# Patient Record
Sex: Female | Born: 1995 | Race: White | Hispanic: No | Marital: Married | Smoking: Never smoker
Health system: Southern US, Community
[De-identification: ages and names within clinical notes are randomized; demographics above are authoritative.]

## PROBLEM LIST (undated history)

## (undated) DIAGNOSIS — F32A Depression, unspecified: Secondary | ICD-10-CM

## (undated) DIAGNOSIS — I10 Essential (primary) hypertension: Secondary | ICD-10-CM

## (undated) DIAGNOSIS — N760 Acute vaginitis: Secondary | ICD-10-CM

## (undated) DIAGNOSIS — F329 Major depressive disorder, single episode, unspecified: Secondary | ICD-10-CM

## (undated) DIAGNOSIS — B9689 Other specified bacterial agents as the cause of diseases classified elsewhere: Secondary | ICD-10-CM

## (undated) HISTORY — DX: Major depressive disorder, single episode, unspecified: F32.9

## (undated) HISTORY — PX: WISDOM TOOTH EXTRACTION: SHX21

## (undated) HISTORY — DX: Depression, unspecified: F32.A

---

## 2017-02-18 ENCOUNTER — Encounter: Payer: Self-pay | Admitting: Family Medicine

## 2017-02-18 ENCOUNTER — Ambulatory Visit (INDEPENDENT_AMBULATORY_CARE_PROVIDER_SITE_OTHER): Payer: BLUE CROSS/BLUE SHIELD | Admitting: Family Medicine

## 2017-02-18 VITALS — BP 124/84 | HR 88 | Ht 63.5 in | Wt 170.0 lb

## 2017-02-18 DIAGNOSIS — L290 Pruritus ani: Secondary | ICD-10-CM

## 2017-02-18 DIAGNOSIS — R4586 Emotional lability: Secondary | ICD-10-CM

## 2017-02-18 DIAGNOSIS — K644 Residual hemorrhoidal skin tags: Secondary | ICD-10-CM | POA: Diagnosis not present

## 2017-02-18 DIAGNOSIS — Z7689 Persons encountering health services in other specified circumstances: Secondary | ICD-10-CM

## 2017-02-18 NOTE — Patient Instructions (Addendum)
It was a pleasure to meet you today! I look forward to partnering with you for your health care needs   Please take your birth control pills every day for 3 months, monitor mood. If no improvement, please come in to discuss  For you itching, Use over the counter hydrocortisone cream 1% twice a day for no more than 7 days straight. After applying hydrocortisone, apply a small amount of diaper cream.

## 2017-02-18 NOTE — Progress Notes (Signed)
   Subjective:    Patient ID: Karen Ellison, female    DOB: 1995-11-29, 21 y.o.   MRN: [id number]  HPI This is a 21 yo female who presents today to establish care. She has recently moved to [facility]. She moved from [place]. She moved here to be with her boyfriend.   Last PAP 1/18, not currently sexually active, misses a lot of doses of OCPs, was put on them due to heavy periods. Has been very moody, not sure if related to recent changes- graduated college, move to [place], unemployed, just recently got a job. Can be very sad and short time later, very happy.   No recent dental care.   Went to [place] 2014. Had nighttime itching and was treated for pinworms, no relief for a long time. Finally resolved for several years. Itching has returned for last several months. Feels a bump. Has tried hemorrhoid cream, with temporary relief. Regular BMs, always "mushy." Feels a "vein." No pain or bleeding.    Past Medical History:  Diagnosis Date  . Depression    No past surgical history on file. Family History  Problem Relation Age of Onset  . Miscarriages / [place] Mother   . Hypertension Father   . Drug abuse Sister    Social History  Substance Use Topics  . Smoking status: Never Smoker  . Smokeless tobacco: Never Used  . Alcohol use Yes     Comment: occ     Review of Systems Per HPI    Objective:   Physical Exam  Constitutional: She is oriented to person, place, and time. She appears well-developed and well-nourished. No distress.  HENT:  Head: Normocephalic and atraumatic.  Eyes: Conjunctivae are normal.  Cardiovascular: Normal rate.   Pulmonary/Chest: Effort normal.  Genitourinary:  Genitourinary Comments: Soft, fleshy skin tag. No erythema.   Neurological: She is alert and oriented to person, place, and time.  Skin: Skin is warm and dry. She is not diaphoretic.  Psychiatric: She has a normal mood and affect. Her behavior is normal. Judgment and thought content  normal.  Vitals reviewed.     BP 124/84 (BP Location: Right Arm, Patient Position: Sitting, Cuff Size: Normal)   Pulse 88   Ht 5' 3.5" (1.613 m)   Wt 170 lb (77.1 kg)   LMP 01/28/2017   SpO2 99%   BMI 29.64 kg/m      Assessment & Plan:  1. Encounter to establish care - Discussed and encouraged healthy lifestyle choices- adequate sleep, regular exercise, stress management and healthy food choices.   2. Anal pruritus - OTC hydrocortisone cream 1%, BID x 7 days max, followed by diaper cream - discussed cleansing and using flushable, moist wipes, avoid over wiping  3. Anal skin tag - patient interested in having removed, will refer to general surgery - Ambulatory referral to General Surgery  4. Mood changes - she has had many life changes and has not been taking OCPs regularly - discussed taking OCPs daily, continuing to work on her new normal- regular schedule, regular sleep, regular exercise.  - if not improved in 3 months, RTC  Olean Ree, FNP-BC  [facility], [facility]  02/18/2017 10:23 AM

## 2017-02-21 ENCOUNTER — Ambulatory Visit: Payer: Self-pay | Admitting: Surgery

## 2017-04-15 ENCOUNTER — Telehealth: Payer: Self-pay | Admitting: *Deleted

## 2017-04-15 ENCOUNTER — Telehealth: Payer: Self-pay | Admitting: Family Medicine

## 2017-04-15 ENCOUNTER — Encounter: Payer: Self-pay | Admitting: *Deleted

## 2017-04-15 NOTE — Telephone Encounter (Signed)
If she has not been taking her birth control pills regularly, she needs to wait until her next period to make sure she is not pregnant.

## 2017-04-15 NOTE — Telephone Encounter (Signed)
Copied from CRM [id number] [phone]. Topic: Inquiry >> Apr 15, 2017 12:47 PM Karen Ellison, [name] wrote: Reason for CRM: Patient took the Plan B pill this morning. She would like to know if she can start taking her birth control pill today or wait a few days or start a new pack. Patient would like for [name] or her assistant to give her a call back today to let her know.

## 2017-04-15 NOTE — Telephone Encounter (Signed)
Spoken and notified patient of Debbie's comments. Patient verbalized understanding. 

## 2017-04-15 NOTE — Telephone Encounter (Signed)
This encounter was created in error - please disregard.

## 2017-04-15 NOTE — Telephone Encounter (Signed)
Pt requested advice about the Plan B pill. Advised pt that she should see a cycle within 1 to 2 days, if not need to take a urine pregnant test before starting back on her birth control pills. Advised pt to call for for any questions. She voiced understanding.

## 2017-05-27 ENCOUNTER — Telehealth: Payer: Self-pay | Admitting: Family Medicine

## 2017-05-27 NOTE — Telephone Encounter (Unsigned)
Copied from CRM [id number]. Topic: Referral - Question >> May 27, 2017 11:47 AM Raquel SarnaHayes, Teresa G wrote: Pt needing to know the name and practice of doctor she was referred to, to have a skin tag removed.

## 2017-06-03 ENCOUNTER — Telehealth: Payer: Self-pay | Admitting: Family Medicine

## 2017-06-03 ENCOUNTER — Ambulatory Visit (INDEPENDENT_AMBULATORY_CARE_PROVIDER_SITE_OTHER): Payer: BLUE CROSS/BLUE SHIELD | Admitting: Surgery

## 2017-06-03 ENCOUNTER — Encounter: Payer: Self-pay | Admitting: Surgery

## 2017-06-03 ENCOUNTER — Ambulatory Visit (INDEPENDENT_AMBULATORY_CARE_PROVIDER_SITE_OTHER): Payer: BLUE CROSS/BLUE SHIELD | Admitting: Internal Medicine

## 2017-06-03 ENCOUNTER — Encounter: Payer: Self-pay | Admitting: Internal Medicine

## 2017-06-03 VITALS — BP 133/85 | HR 85 | Temp 98.4°F | Ht 63.5 in | Wt 169.8 lb

## 2017-06-03 VITALS — BP 122/82 | HR 72 | Temp 98.1°F | Wt 168.0 lb

## 2017-06-03 DIAGNOSIS — N949 Unspecified condition associated with female genital organs and menstrual cycle: Secondary | ICD-10-CM | POA: Diagnosis not present

## 2017-06-03 DIAGNOSIS — R300 Dysuria: Secondary | ICD-10-CM | POA: Diagnosis not present

## 2017-06-03 DIAGNOSIS — L290 Pruritus ani: Secondary | ICD-10-CM | POA: Diagnosis not present

## 2017-06-03 DIAGNOSIS — K644 Residual hemorrhoidal skin tags: Secondary | ICD-10-CM | POA: Diagnosis not present

## 2017-06-03 LAB — POCT URINE PREGNANCY: Preg Test, Ur: NEGATIVE

## 2017-06-03 LAB — POC URINALSYSI DIPSTICK (AUTOMATED)
BILIRUBIN UA: NEGATIVE
Blood, UA: NEGATIVE
GLUCOSE UA: NEGATIVE
KETONES UA: NEGATIVE
Nitrite, UA: NEGATIVE
Protein, UA: NEGATIVE
Spec Grav, UA: 1.02 (ref 1.010–1.025)
Urobilinogen, UA: 0.2 E.U./dL
pH, UA: 6 (ref 5.0–8.0)

## 2017-06-03 MED ORDER — FLUCONAZOLE 150 MG PO TABS: 150.0000 mg | ORAL_TABLET | Freq: Once | ORAL | 1 refills | Status: AC

## 2017-06-03 NOTE — Progress Notes (Signed)
   Subjective:    Patient ID: Karen Ellison, female    DOB: 1995-06-11, 22 y.o.   MRN: [id number]  HPI Here due to urinary symptoms  Having burning dysuria and vaginally (even when not voiding) Has had frequency Discomfort during sex for past 3 days No vaginal discharge  Single partner--together for 2 years but only started sex 2 months ago No STD in past Condoms but inconsistent  Current Outpatient Medications on File Prior to Visit  Medication Sig Dispense Refill  . norgestimate-ethinyl estradiol (ORTHO-CYCLEN,SPRINTEC,PREVIFEM) 0.25-35 MG-MCG tablet Take 1 tablet by mouth daily.     No current facility-administered medications on file prior to visit.     No Known Allergies  Past Medical History:  Diagnosis Date  . Depression     History reviewed. No pertinent surgical history.  Family History  Problem Relation Age of Onset  . Miscarriages / [place] Mother   . Hypertension Father   . Drug abuse Sister   . Cancer Paternal Aunt   . Cancer Paternal Uncle   . Cancer Maternal Grandmother     Social History   Socioeconomic History  . Marital status: Single    Spouse name: Not on file  . Number of children: Not on file  . Years of education: Not on file  . Highest education level: Not on file  Social Needs  . Financial resource strain: Not on file  . Food insecurity - worry: Not on file  . Food insecurity - inability: Not on file  . Transportation needs - medical: Not on file  . Transportation needs - non-medical: Not on file  Occupational History  . Not on file  Tobacco Use  . Smoking status: Never Smoker  . Smokeless tobacco: Never Used  Substance and Sexual Activity  . Alcohol use: Yes    Comment: occ  . Drug use: No  . Sexual activity: No    Birth control/protection: Pill  Other Topics Concern  . Not on file  Social History Narrative  . Not on file   Review of Systems No fever LMP--1/4. Normal for her No abdominal pain No N/V Appetite is  normal No missed pills with OCP    Objective:   Physical Exam  Abdominal: Soft. There is no tenderness.  Genitourinary:  Genitourinary Comments: Mild periurethral inflammation Greenish vaginal discharge No CMT          Assessment & Plan:

## 2017-06-03 NOTE — Progress Notes (Signed)
Surgical Clinic History and Physical  Referring provider:  Emi BelfastGessner, Deborah B, FNP [address] [place], [place] [id number]  HISTORY OF PRESENT ILLNESS (HPI):  22 y.o. pleasant and healthy female who [place] [facility] to live with her boyfried following [place] presents for evaluation of chronic peri-anal itching x many years, worse at night than morning and associated with frequent loose BM's BID-TID that she says improved with dietary changes recommended by her PMD, such as increasing fiber, and worsens when she eats "unhealthy foods". Patient also describes she can feel a small skin tag and requests it be removed for relief of her itching. Patient also describes urovaginal "burning, like on fire" and says her perianal itching was relieved with the topical steroid cream prescribed by her PMD this past October. Patient denies constipation, blood with BM's, perianal pain, fever/chills, N/V, CP, or SOB.  PAST MEDICAL HISTORY (PMH):  Past Medical History:  Diagnosis Date  . Depression      PAST SURGICAL HISTORY (PSH):  History reviewed. No pertinent surgical history.   MEDICATIONS:  Prior to Admission medications   Medication Sig Start Date End Date Taking? Authorizing Provider  norgestimate-ethinyl estradiol (ORTHO-CYCLEN,SPRINTEC,PREVIFEM) 0.25-35 MG-MCG tablet Take 1 tablet by mouth daily.   Yes [provider]     ALLERGIES:  No Known Allergies   SOCIAL HISTORY:  Social History   Socioeconomic History  . Marital status: Single    Spouse name: Not on file  . Number of children: Not on file  . Years of education: Not on file  . Highest education level: Not on file  Social Needs  . Financial resource strain: Not on file  . Food insecurity - worry: Not on file  . Food insecurity - inability: Not on file  . Transportation needs - medical: Not on file  . Transportation needs - non-medical: Not on file   Occupational History  . Not on file  Tobacco Use  . Smoking status: Never Smoker  . Smokeless tobacco: Never Used  Substance and Sexual Activity  . Alcohol use: Yes    Comment: occ  . Drug use: No  . Sexual activity: No    Birth control/protection: Pill  Other Topics Concern  . Not on file  Social History Narrative  . Not on file    The patient currently resides (home / rehab facility / nursing home): Home The patient normally is (ambulatory / bedbound): Ambulatory  FAMILY HISTORY:  Family History  Problem Relation Age of Onset  . Miscarriages / [place] Mother   . Hypertension Father   . Drug abuse Sister   . Cancer Paternal Aunt   . Cancer Paternal Uncle   . Cancer Maternal Grandmother     Otherwise negative/non-contributory.  REVIEW OF SYSTEMS:  Constitutional: denies any other weight loss, fever, chills, or sweats  Eyes: denies any other vision changes, history of eye injury  ENT: denies sore throat, hearing problems  Respiratory: denies shortness of breath, wheezing  Cardiovascular: denies chest pain, palpitations  Gastrointestinal: perianal itching, abdominal symptoms, N/V, and bowel function as per HPI Musculoskeletal: denies any other joint pains or cramps  Skin: Denies any other rashes or skin discolorations Neurological: denies any other headache, dizziness, weakness  Psychiatric: Denies any other depression, anxiety   All other review of systems were otherwise negative   VITAL SIGNS:  BP 133/85   Pulse 85   Temp 98.4 F (36.9 C) (Oral)  Ht 5' 3.5" (1.613 m)   Wt 169 lb 12.8 oz (77 kg)   BMI 29.61 kg/m   PHYSICAL EXAM:  Constitutional:  -- Normal body habitus  -- Awake, alert, and oriented x3  Eyes:  -- Pupils equally round and reactive to light  -- No scleral icterus  Ear, nose, throat:  -- No jugular venous distension -- No nasal drainage, bleeding Pulmonary:  -- No crackles  -- Equal breath sounds bilaterally -- Breathing  non-labored at rest Cardiovascular:  -- S1, S2 present  -- No pericardial rubs  Gastrointestinal:  -- Abdomen soft, nontender, non-distended, no guarding/rebound  -- No abdominal masses appreciated, pulsatile or otherwise  Anorectal: -- Small non-tender anterior perianal skin tag without surrounding erythema or evidence of inflammation -- Anal sphincter tone WNL, no gross blood, no visible or palpable hemorrhoids, no ulceration or fistula Musculoskeletal and Integumentary:  -- Wounds or skin discoloration: None appreciated -- Extremities: B/L UE and LE FROM, hands and feet warm, no edema  Neurologic:  -- Motor function: Intact and symmetric -- Sensation: Intact and symmetric  Labs: CBC: No results found for: WBC, RBC BMP: No results found for: GLUCOSE, POTASSIUM, CHLORIDE, CO2, BUN, CREATININE, CALCIUM   Imaging studies: No pertinent imaging studies available for review   Assessment/Plan:  22 y.o. female with perianal itching not likely attributable to patient's otherwise asymptomatic anterior perianal skin tag, complicated by chronic and relatively frequent loose BM's, previously improved with dietary changes.   - PMD to evaluate for UTI, fungal/yeast infection  - topical hydrocortisone cream prn itching and dietary modification to reduce frequent loose BM's  - if continues with frequent loose BM's despite dietary changes, consider Imodium or similar and GI referral  - anterior skin tag can certainly be excised if patient wishes (low risk for surgery), but seems unlikely to relieve itching (patient's primary complaint and reason for seeking evaluation and treatment)  - return to clinic as needed, instructed to call if any questions or concerns  All of the above recommendations were discussed with the patient and patient's boyfriend, and all of patient's and her boyfriend's questions were answered to their expressed satisfaction.  Thank you for the opportunity to participate in this  patient's care.  -- Scherrie Gerlach Earlene Plater, MD, RPVI [place]: [facility] General Surgery - Partnering for exceptional care. Office: [phone]

## 2017-06-03 NOTE — Telephone Encounter (Signed)
I spoke with pt and she said [facility] advised not to remove skin tag but schedule appt for UTI with PCP. Pt scheduled to see Dr Alphonsus SiasLetvak today at 4:30 pm for ?UTI.

## 2017-06-03 NOTE — Patient Instructions (Signed)
Please follow up with your primary care doctor. Please call our office if you have questions or concerns.

## 2017-06-03 NOTE — Telephone Encounter (Signed)
Copied from CRM [id number]. Topic: General - Other >> Jun 03, 2017 11:23 AM Stephannie LiSimmons, Fiana Gladu L, NT wrote: Reason for CRM: Patient went to see [facility] to have a skin tag removed ,and was told to call her PCP they refused to do the removal ,they think she has a uti please advise [phone]

## 2017-06-03 NOTE — Patient Instructions (Signed)
You can try over the counter monistat or other vaginal yeast treatment--in addition to the oral medication. You can try some 1% hydrocortisone cream to help the inflammation around your urethra.

## 2017-06-03 NOTE — Telephone Encounter (Signed)
I don't really understand this but will decide on what to do at the OV

## 2017-06-03 NOTE — Assessment & Plan Note (Addendum)
And painful intercourse and dysuria recently Urinalysis does show leukocytes so this could all be urinary--but less likely with the exam No clue cells and whiff test negative No CMT so, despite the discharge--I doubt cervicitis---will send GC and chlamydia though No trichomonas on wet prep  Has periurethral inflammation and yeast on wet prep--so probably yeast vaginitis Pregnancy test negative Will try fluconazole--- and she can use OTC yeast Abstain from sex and be sure to use condoms

## 2017-06-04 LAB — C. TRACHOMATIS/N. GONORRHOEAE RNA
C. trachomatis RNA, TMA: NOT DETECTED
N. GONORRHOEAE RNA, TMA: NOT DETECTED

## 2017-06-05 ENCOUNTER — Encounter: Payer: Self-pay | Admitting: Surgery

## 2017-06-06 ENCOUNTER — Encounter: Payer: Self-pay | Admitting: Internal Medicine

## 2017-06-06 MED ORDER — METRONIDAZOLE 500 MG PO TABS: 500.0000 mg | ORAL_TABLET | Freq: Two times a day (BID) | ORAL | 0 refills | Status: DC

## 2017-07-11 ENCOUNTER — Other Ambulatory Visit: Payer: Self-pay | Admitting: Family Medicine

## 2017-07-11 NOTE — Telephone Encounter (Signed)
Copied from CRM [id number]. Topic: Quick Communication - Rx Refill/Question >> Jul 11, 2017  2:17 PM Arlyss Gandyichardson, Abrina Petz N, NT wrote: Medication:  norgestimate-ethinyl estradiol (ORTHO-CYCLEN,SPRINTEC,PREVIFEM)   Has the patient contacted their pharmacy? Yes.     (Agent: If no, request that the patient contact the pharmacy for the refill.)   Preferred Pharmacy (with phone number or street name): [facility] [address]   Agent: Please be advised that RX refills may take up to 3 business days. We ask that you follow-up with your pharmacy.

## 2017-07-12 MED ORDER — NORGESTIMATE-ETH ESTRADIOL 0.25-35 MG-MCG PO TABS: 1.0000 | ORAL_TABLET | Freq: Every day | ORAL | 1 refills | Status: DC

## 2017-07-12 NOTE — Telephone Encounter (Signed)
Pt has not had recent CPE. pls advise

## 2017-07-12 NOTE — Telephone Encounter (Signed)
Last OV: 02/18/17 PCP: Leone PayorGessner Pharmacy: [id number] [place] [place] - [phone] (Phone) [phone] (Fax)   Medication filled by another provider.

## 2017-11-19 ENCOUNTER — Ambulatory Visit: Payer: BLUE CROSS/BLUE SHIELD | Admitting: Family Medicine

## 2017-11-19 ENCOUNTER — Encounter: Payer: Self-pay | Admitting: Family Medicine

## 2017-11-19 VITALS — BP 120/76 | HR 92 | Temp 98.2°F | Ht 63.5 in | Wt 168.5 lb

## 2017-11-19 DIAGNOSIS — R1084 Generalized abdominal pain: Secondary | ICD-10-CM | POA: Diagnosis not present

## 2017-11-19 DIAGNOSIS — R1031 Right lower quadrant pain: Secondary | ICD-10-CM | POA: Diagnosis not present

## 2017-11-19 DIAGNOSIS — N898 Other specified noninflammatory disorders of vagina: Secondary | ICD-10-CM

## 2017-11-19 LAB — POC URINALSYSI DIPSTICK (AUTOMATED)
Glucose, UA: NEGATIVE
Leukocytes, UA: NEGATIVE
Nitrite, UA: NEGATIVE
PH UA: 6.5 (ref 5.0–8.0)
Protein, UA: POSITIVE — AB
RBC UA: NEGATIVE
Spec Grav, UA: 1.02 (ref 1.010–1.025)
Urobilinogen, UA: 0.2 E.U./dL

## 2017-11-19 LAB — CBC WITH DIFFERENTIAL/PLATELET
BASOS ABS: 0 10*3/uL (ref 0.0–0.1)
Basophils Relative: 0.1 % (ref 0.0–3.0)
EOS ABS: 0 10*3/uL (ref 0.0–0.7)
Eosinophils Relative: 0.6 % (ref 0.0–5.0)
HEMATOCRIT: 36.9 % (ref 36.0–46.0)
HEMOGLOBIN: 12.4 g/dL (ref 12.0–15.0)
LYMPHS PCT: 26.9 % (ref 12.0–46.0)
Lymphs Abs: 2.1 10*3/uL (ref 0.7–4.0)
MCHC: 33.8 g/dL (ref 30.0–36.0)
MCV: 82.9 fl (ref 78.0–100.0)
Monocytes Absolute: 0.5 10*3/uL (ref 0.1–1.0)
Monocytes Relative: 5.8 % (ref 3.0–12.0)
NEUTROS ABS: 5.2 10*3/uL (ref 1.4–7.7)
Neutrophils Relative %: 66.6 % (ref 43.0–77.0)
PLATELETS: 236 10*3/uL (ref 150.0–400.0)
RBC: 4.45 Mil/uL (ref 3.87–5.11)
RDW: 16 % — ABNORMAL HIGH (ref 11.5–15.5)
WBC: 7.8 10*3/uL (ref 4.0–10.5)

## 2017-11-19 LAB — COMPREHENSIVE METABOLIC PANEL
ALBUMIN: 4 g/dL (ref 3.5–5.2)
ALT: 10 U/L (ref 0–35)
AST: 15 U/L (ref 0–37)
Alkaline Phosphatase: 38 U/L — ABNORMAL LOW (ref 39–117)
BILIRUBIN TOTAL: 0.3 mg/dL (ref 0.2–1.2)
BUN: 7 mg/dL (ref 6–23)
CALCIUM: 9.1 mg/dL (ref 8.4–10.5)
CHLORIDE: 103 meq/L (ref 96–112)
CO2: 30 meq/L (ref 19–32)
Creatinine, Ser: 0.78 mg/dL (ref 0.40–1.20)
GFR: 96.21 mL/min (ref >60.00)
Glucose, Bld: 97 mg/dL (ref 70–99)
Potassium: 5 mEq/L (ref 3.5–5.1)
Sodium: 137 mEq/L (ref 135–145)
Total Protein: 7.4 g/dL (ref 6.0–8.3)

## 2017-11-19 LAB — POCT URINE PREGNANCY: Preg Test, Ur: NEGATIVE

## 2017-11-19 LAB — LIPASE: Lipase: 22 U/L (ref 11.0–59.0)

## 2017-11-19 NOTE — Progress Notes (Signed)
BP 120/76 (BP Location: Left Arm, Patient Position: Sitting, Cuff Size: Normal)   Pulse 92   Temp 98.2 F (36.8 C) (Oral)   Ht 5' 3.5" (1.613 m)   Wt 168 lb 8 oz (76.4 kg)   LMP 11/02/2017   SpO2 100%   BMI 29.38 kg/m    CC: abd pain, back pain Subjective:    Patient ID: Karen Ellison, female    DOB: [birth year], 22 y.o.   MRN: [id number]  HPI: Karen Ellison is a 22 y.o. female presenting on 11/19/2017 for Back Pain (Located in upper mid back. Has improved.) and Abdominal Pain (C/o RLQ sharp pain, including during intercourse. Pain is worsening. Also, c/o severe bloatiing. Started about 2.5 wks ago. Has taken home pregnancy tests, negative results.)   2.5 wk h/o general abdominal pain described as uncomfortable bloating and pressure, mid back pain. Back and abd pain started on same day. Intermittent nausea. Increased bloating, indigestion. Worse RLQ stabbing pain with sex.  Some increased urinary frequency.   No dysuria, urgency or hematuria. No fevers/chills, flank pain, or vomiting. No diarrhea or constipation or bowel changes. No blood in stool. Denies vaginal discharge, vaginal rash or other vaginal symptoms.   Has taken several home pregnancy tests that were negative, last was 11/13/2017.  Takes daily OCP, no missed doses.  LMP 11/07/2017.   Relevant past medical, surgical, family and social history reviewed and updated as indicated. Interim medical history since our last visit reviewed. Allergies and medications reviewed and updated. Outpatient Medications Prior to Visit  Medication Sig Dispense Refill  . norgestimate-ethinyl estradiol (ORTHO-CYCLEN,SPRINTEC,PREVIFEM) 0.25-35 MG-MCG tablet Take 1 tablet by mouth daily. 3 Package 1  . metroNIDAZOLE (FLAGYL) 500 MG tablet Take 1 tablet (500 mg total) by mouth 2 (two) times daily. 10 tablet 0   No facility-administered medications prior to visit.      Per HPI unless specifically indicated in ROS section below Review of  Systems     Objective:    BP 120/76 (BP Location: Left Arm, Patient Position: Sitting, Cuff Size: Normal)   Pulse 92   Temp 98.2 F (36.8 C) (Oral)   Ht 5' 3.5" (1.613 m)   Wt 168 lb 8 oz (76.4 kg)   LMP 11/02/2017   SpO2 100%   BMI 29.38 kg/m   Wt Readings from Last 3 Encounters:  11/19/17 168 lb 8 oz (76.4 kg)  06/03/17 168 lb (76.2 kg)  06/03/17 169 lb 12.8 oz (77 kg)    Physical Exam  Constitutional: She appears well-developed and well-nourished.  Eyes: Pupils are equal, round, and reactive to light. EOM are normal.  Cardiovascular: Normal rate, regular rhythm and normal heart sounds.  No murmur heard. Pulmonary/Chest: Effort normal and breath sounds normal. No respiratory distress. She has no wheezes. She has no rhonchi. She has no rales.  Abdominal: Soft. Normal appearance, normal aorta and bowel sounds are normal. She exhibits no distension, no fluid wave and no mass. There is no hepatosplenomegaly. There is tenderness (moderate) in the right upper quadrant, right lower quadrant, epigastric area, suprapubic area and left lower quadrant. There is guarding (mild) and positive Murphy's sign. There is no rigidity, no rebound and no CVA tenderness.  Genitourinary: Uterus normal. Pelvic exam was performed with patient supine. There is no rash, tenderness or lesion on the right labia. There is no rash, tenderness or lesion on the left labia. Cervix exhibits discharge. Cervix exhibits no motion tenderness and no friability. Right adnexum displays no mass,  no tenderness and no fullness. Left adnexum displays no mass, no tenderness and no fullness. Vaginal discharge found.  Genitourinary Comments: Thicker white discharge present, not symptomatic  Skin: Skin is warm and dry. No rash noted.  Psychiatric: She has a normal mood and affect.  Nursing note and vitals reviewed.  Results for orders placed or performed in visit on 11/19/17  WET PREP BY MOLECULAR PROBE  Result Value Ref Range    MICRO NUMBER: [id number]    SPECIMEN QUALITY: ADEQUATE    SOURCE: NOT GIVEN    STATUS: FINAL    Trichomonas vaginosis Not Detected    Gardnerella vaginalis Not Detected    Candida species Not Detected   C. trachomatis/N. gonorrhoeae RNA  Result Value Ref Range   C. trachomatis RNA, TMA NOT DETECTED NOT DETECT   N. gonorrhoeae RNA, TMA NOT DETECTED NOT DETECT  Comprehensive metabolic panel  Result Value Ref Range   Sodium 137 135 - 145 mEq/L   Potassium 5.0 3.5 - 5.1 mEq/L   Chloride 103 96 - 112 mEq/L   CO2 30 19 - 32 mEq/L   Glucose, Bld 97 70 - 99 mg/dL   BUN 7 6 - 23 mg/dL   Creatinine, Ser 5.400.78 0.40 - 1.20 mg/dL   Total Bilirubin 0.3 0.2 - 1.2 mg/dL   Alkaline Phosphatase 38 (L) 39 - 117 U/L   AST 15 0 - 37 U/L   ALT 10 0 - 35 U/L   Total Protein 7.4 6.0 - 8.3 g/dL   Albumin 4.0 3.5 - 5.2 g/dL   Calcium 9.1 8.4 - 98.110.5 mg/dL   GFR 19.1496.21 >78.29>60.00 mL/min  CBC with Differential/Platelet  Result Value Ref Range   WBC 7.8 4.0 - 10.5 K/uL   RBC 4.45 3.87 - 5.11 Mil/uL   Hemoglobin 12.4 12.0 - 15.0 g/dL   HCT 56.236.9 13.036.0 - 86.546.0 %   MCV 82.9 78.0 - 100.0 fl   MCHC 33.8 30.0 - 36.0 g/dL   RDW 78.416.0 (H) 69.611.5 - 29.515.5 %   Platelets 236.0 150.0 - 400.0 K/uL   Neutrophils Relative % 66.6 43.0 - 77.0 %   Lymphocytes Relative 26.9 12.0 - 46.0 %   Monocytes Relative 5.8 3.0 - 12.0 %   Eosinophils Relative 0.6 0.0 - 5.0 %   Basophils Relative 0.1 0.0 - 3.0 %   Neutro Abs 5.2 1.4 - 7.7 K/uL   Lymphs Abs 2.1 0.7 - 4.0 K/uL   Monocytes Absolute 0.5 0.1 - 1.0 K/uL   Eosinophils Absolute 0.0 0.0 - 0.7 K/uL   Basophils Absolute 0.0 0.0 - 0.1 K/uL  Lipase  Result Value Ref Range   Lipase 22.0 11.0 - 59.0 U/L  POCT Urinalysis Dipstick (Automated)  Result Value Ref Range   Color, UA gold    Clarity, UA clear    Glucose, UA Negative Negative   Bilirubin, UA 1+    Ketones, UA trace    Spec Grav, UA 1.020 1.010 - 1.025   Blood, UA negative    pH, UA 6.5 5.0 - 8.0   Protein, UA Positive (A)  Negative   Urobilinogen, UA 0.2 0.2 or 1.0 E.U./dL   Nitrite, UA negative    Leukocytes, UA Negative Negative  POCT urine pregnancy  Result Value Ref Range   Preg Test, Ur Negative Negative      Assessment & Plan:   Problem List Items Addressed This Visit    RLQ abdominal pain   Relevant Orders   US Abdomen  Complete   US PELVIC COMPLETE WITH TRANSVAGINAL   Generalized abdominal pain - Primary    Several weeks of abdominal pain of unclear etiology. Large differential.  Upreg negative. UA normal.  Check labs. No pain to palpation cervix/uterus or adnexal fullness on pelvic exam today - vaginal discharge present however not symptomatic at this time. Will send wet prep and CT/GC swab. Will check labwork today. Not consistent with appendicitis given duration of illness. ?gallstone disease.  Discussed CT scan for further evaluation - will start with abd/pelvic US for less radiation exposure, looking for possible ovarian cyst vs other.  Red flags to seek further care reviewed.       Relevant Orders   POCT Urinalysis Dipstick (Automated) (Completed)   Comprehensive metabolic panel (Completed)   CBC with Differential/Platelet (Completed)   Lipase (Completed)   US Abdomen Complete   POCT urine pregnancy (Completed)   US PELVIC COMPLETE WITH TRANSVAGINAL    Other Visit Diagnoses    Vaginal discharge       Relevant Orders   WET PREP BY MOLECULAR PROBE (Completed)   C. trachomatis/N. gonorrhoeae RNA (Completed)       No orders of the defined types were placed in this encounter.  Orders Placed This Encounter  Procedures  . WET PREP BY MOLECULAR PROBE  . C. trachomatis/N. gonorrhoeae RNA  . US Abdomen Complete    Epic order Wt 168/ no needs Ins- bcbs  Pda/pt and miriam [phone]    Standing Status:   Future    Standing Expiration Date:   01/21/2019    Order Specific Question:   Reason for Exam (SYMPTOM  OR DIAGNOSIS REQUIRED)    Answer:   abdominal pain    Order Specific  Question:   Preferred imaging location?    Answer:   [facility]  . US PELVIC COMPLETE WITH TRANSVAGINAL    Epic order Wt 168/ no needs Ins- bcbs  Pda/pt and miriam [phone]    Standing Status:   Future    Standing Expiration Date:   01/21/2019    Order Specific Question:   Reason for Exam (SYMPTOM  OR DIAGNOSIS REQUIRED)    Answer:   abd pain, RLQ eval ovaries    Order Specific Question:   Preferred imaging location?    Answer:   [facility]  . Comprehensive metabolic panel  . CBC with Differential/Platelet  . Lipase  . POCT Urinalysis Dipstick (Automated)  . POCT urine pregnancy    Follow up plan: Return if symptoms worsen or fail to improve.  Eustaquio Boyden, MD

## 2017-11-19 NOTE — Patient Instructions (Signed)
Urinalysis returned normal Urine pregnancy returned normal Labs today We will send testing for wet prep as well as order abdominal and pelvic ultrasounds.  We will be in touch with results.

## 2017-11-20 LAB — WET PREP BY MOLECULAR PROBE
CANDIDA SPECIES: NOT DETECTED
Gardnerella vaginalis: NOT DETECTED
MICRO NUMBER:: 90811298
SPECIMEN QUALITY:: ADEQUATE
Trichomonas vaginosis: NOT DETECTED

## 2017-11-20 LAB — C. TRACHOMATIS/N. GONORRHOEAE RNA
C. TRACHOMATIS RNA, TMA: NOT DETECTED
N. gonorrhoeae RNA, TMA: NOT DETECTED

## 2017-11-21 DIAGNOSIS — R1031 Right lower quadrant pain: Secondary | ICD-10-CM | POA: Insufficient documentation

## 2017-11-22 ENCOUNTER — Telehealth: Payer: Self-pay | Admitting: Family Medicine

## 2017-11-22 NOTE — Telephone Encounter (Signed)
plz call for an update on abdominal pain symptoms If worsening, may recommend CT scan sooner.

## 2017-11-22 NOTE — Telephone Encounter (Signed)
Spoke with pt asking about abd pain.  States the pain is not worse, just not any better.

## 2017-11-22 NOTE — Telephone Encounter (Signed)
Spoke with Shirlee LimerickMarion - will ask to expedite imaging studies.

## 2017-11-22 NOTE — Assessment & Plan Note (Signed)
Several weeks of abdominal pain of unclear etiology. Large differential.  Upreg negative. UA normal.  Check labs. No pain to palpation cervix/uterus or adnexal fullness on pelvic exam today - vaginal discharge present however not symptomatic at this time. Will send wet prep and CT/GC swab. Will check labwork today. Not consistent with appendicitis given duration of illness. ?gallstone disease.  Discussed CT scan for further evaluation - will start with abd/pelvic US for less radiation exposure, looking for possible ovarian cyst vs other.  Red flags to seek further care reviewed.

## 2017-11-22 NOTE — Addendum Note (Signed)
Addended by: Eustaquio BoydenGUTIERREZ, Denelda Akerley on: 11/22/2017 02:36 PM   Modules accepted: Orders

## 2017-11-28 ENCOUNTER — Encounter: Payer: Self-pay | Admitting: Family Medicine

## 2017-11-28 ENCOUNTER — Telehealth: Payer: Self-pay | Admitting: Family Medicine

## 2017-11-28 ENCOUNTER — Other Ambulatory Visit: Payer: BLUE CROSS/BLUE SHIELD

## 2017-11-28 DIAGNOSIS — R1031 Right lower quadrant pain: Secondary | ICD-10-CM

## 2017-11-28 DIAGNOSIS — N83201 Unspecified ovarian cyst, right side: Secondary | ICD-10-CM

## 2017-11-28 NOTE — Telephone Encounter (Signed)
Spoke with pt relaying US results and instructions, per Dr. Reece AgarG.  Pt verbalizes understanding.

## 2017-11-28 NOTE — Telephone Encounter (Signed)
Pt calling requesting a call with her US results.

## 2017-11-28 NOTE — Telephone Encounter (Signed)
plz call:  abd US returned normal. Pelvic US showed benign large R ovarian cyst. This is where pain is coming from. May treat with ibuprofen 600mg  with meals for 5 days. If pain is not improving, recommend OBGYN evaluation - let us know if referral desired. She also had small uterine fibroid which is asymptomatic at this time.  Will send ultrasound reports for scanning.  [facility]  [facility]c PCP FYI

## 2017-11-29 NOTE — Telephone Encounter (Signed)
GYN referral placed - plz call Monday to schedule appt

## 2017-11-29 NOTE — Addendum Note (Signed)
Addended by: Eustaquio BoydenGUTIERREZ, Mylia Pondexter on: 11/29/2017 05:02 PM   Modules accepted: Orders

## 2017-12-05 NOTE — Telephone Encounter (Signed)
Dr Sharen HonesGutierrez please review recent mychart message from patient and her questions. Thank Neta Mendsyou-Anastasiya V Hopkins, RMA

## 2017-12-08 NOTE — Telephone Encounter (Signed)
I replied to patient. Will await her decision.

## 2017-12-11 NOTE — Telephone Encounter (Signed)
Pt requests GYN referral. thanks

## 2017-12-18 ENCOUNTER — Encounter: Payer: Self-pay | Admitting: Obstetrics and Gynecology

## 2017-12-18 ENCOUNTER — Ambulatory Visit: Payer: BLUE CROSS/BLUE SHIELD | Admitting: Obstetrics and Gynecology

## 2017-12-18 VITALS — BP 121/83 | HR 73 | Resp 16 | Ht 64.0 in | Wt 169.8 lb

## 2017-12-18 DIAGNOSIS — Z113 Encounter for screening for infections with a predominantly sexual mode of transmission: Secondary | ICD-10-CM | POA: Diagnosis not present

## 2017-12-18 DIAGNOSIS — N83201 Unspecified ovarian cyst, right side: Secondary | ICD-10-CM | POA: Diagnosis not present

## 2017-12-18 DIAGNOSIS — Z124 Encounter for screening for malignant neoplasm of cervix: Secondary | ICD-10-CM

## 2017-12-18 DIAGNOSIS — R1030 Lower abdominal pain, unspecified: Secondary | ICD-10-CM

## 2017-12-18 MED ORDER — KETOROLAC TROMETHAMINE 10 MG PO TABS: 10.0000 mg | ORAL_TABLET | Freq: Four times a day (QID) | ORAL | 0 refills | Status: DC | PRN

## 2017-12-18 NOTE — Progress Notes (Signed)
Last PAP: Last 2017 - Normal                  2016 - Abnormal; HPV  No Surgies

## 2017-12-18 NOTE — Progress Notes (Signed)
Obstetrics and Gynecology New Patient Evaluation  Appointment Date: 12/18/2017  OBGYN Clinic: [facility]  Primary Care Provider: Emi Belfast  Referring Provider: Eustaquio Boyden, MD  Chief Complaint:  Chief Complaint  Patient presents with  . Gynecologic Exam  . Abdominal Pain    Bilateral lower quadrants x 2 months  . Abdominal Pain    suprapubic x 2 months    History of Present Illness: Karen Ellison is a 22 y.o. Caucasian G0 (Patient's last menstrual period was 11/13/2017 (approximate).), seen for the above chief complaint. Her past medical history is significant for long term OCP use.   Patient seen by her PCP on 11/19/17 for abdominal pain. PCP work up had negative CBC, cmp, u/a, UPT, lipase, wet prep and GC/CT testing. Patient had an u/s that showed 4-5cm simple RO cyst.   She states that it started about 11m ago; it woke her up from sleep, it is mid to r>l and back pain. No prior h/o. She states it's persistent and the RLQ pain feels sharp, also notes pain with sex. No LUTs s/s except sometimes it doesn't feel that she empties her bladder all the way. Some help with OTC NSAID and heating pad.  She has been on the piill since mid to late teens, and has been on the current one for past two years.   No diarrhea, constipation, blood in BMs or urine. +bloating.  Review of Systems:  as noted in the History of Present Illness.  Past Medical History:  Past Medical History:  Diagnosis Date  . Depression     Past Surgical History:  No past surgical history on file.  Past Obstetrical History:  OB History  Gravida Para Term Preterm AB Living  0 0 0 0 0 0  SAB TAB Ectopic Multiple Live Births  0 0 0 0 0    Past Gynecological History: As per HPI. Periods: qmonth, regular, light, not painful, <1wk History of Pap Smear(s): Yes.   Last pap unsure ?few years ago, which was normal History of STI(s): No.   Social History:  Social History    Socioeconomic History  . Marital status: Single    Spouse name: Not on file  . Number of children: Not on file  . Years of education: Not on file  . Highest education level: Not on file  Occupational History  . Not on file  Social Needs  . Financial resource strain: Not on file  . Food insecurity:    Worry: Not on file    Inability: Not on file  . Transportation needs:    Medical: Not on file    Non-medical: Not on file  Tobacco Use  . Smoking status: Never Smoker  . Smokeless tobacco: Never Used  Substance and Sexual Activity  . Alcohol use: Yes    Comment: occ  . Drug use: No  . Sexual activity: Never    Birth control/protection: Pill  Lifestyle  . Physical activity:    Days per week: Not on file    Minutes per session: Not on file  . Stress: Not on file  Relationships  . Social connections:    Talks on phone: Not on file    Gets together: Not on file    Attends religious service: Not on file    Active member of club or organization: Not on file    Attends meetings of clubs or organizations: Not on file    Relationship status: Not on file  .  Intimate partner violence:    Fear of current or ex partner: Not on file    Emotionally abused: Not on file    Physically abused: Not on file    Forced sexual activity: Not on file  Other Topics Concern  . Not on file  Social History Narrative  . Not on file    Family History:  Family History  Problem Relation Age of Onset  . Miscarriages / [place] Mother   . Hypertension Father   . Drug abuse Sister   . Cancer Paternal Aunt   . Cancer Paternal Uncle   . Cancer Maternal Grandmother    She denies any female cancers   Medications Karen Ellison had no medications administered during this visit. Current Outpatient Medications  Medication Sig Dispense Refill  . norgestimate-ethinyl estradiol (ORTHO-CYCLEN,SPRINTEC,PREVIFEM) 0.25-35 MG-MCG tablet Take 1 tablet by mouth daily. 3 Package 1  . ketorolac  (TORADOL) 10 MG tablet Take 1 tablet (10 mg total) by mouth every 6 (six) hours as needed. 20 tablet 0   No current facility-administered medications for this visit.     Allergies Patient has no known allergies.   Physical Exam:  BP 121/83 (BP Location: Left Arm, Patient Position: Sitting, Cuff Size: Normal)   Pulse 73   Resp 16   Ht 5\' 4"  (1.626 m)   Wt 169 lb 12.8 oz (77 kg)   LMP 11/13/2017 (Approximate)   BMI 29.15 kg/m  Body mass index is 29.15 kg/m. General appearance: Well nourished, well developed female in no acute distress.  Neck:  Supple, normal appearance, and no thyromegaly  Cardiovascular: normal s1 and s2.  No murmurs, rubs or gallops. Respiratory:  Clear to auscultation bilateral. Normal respiratory effort Abdomen: positive bowel sounds and no masses, hernias; diffusely non tender to palpation, non distended Neuro/Psych:  Normal mood and affect.  Skin:  Warm and dry.  Lymphatic:  No inguinal lymphadenopathy.   Pelvic exam: is not limited by body habitus EGBUS: within normal limits, Vagina: within normal limits and with no blood or discharge in the vault, Cervix: normal appearing cervix without tenderness, discharge or lesions. Uterus:  nonenlarged and non tender and Adnexa:  normal adnexa and no mass, fullness, tenderness Rectovaginal: deferred  Laboratory: none  Radiology: see u/s note but negative u/s. NO cysts seen on either ovary.   7/16 [facility] u/s reads (see media tab) Pelvic: uterus 8.4 x 3.1 x 5.1cm with 1.6 x 1 x 1.3cm IM fibroid, ES 4mm and normal, normal cx, RO 5.1 x 3.4 x 4.6cm with 4.6 x 3.6 x 3.7cm simple cyst with +AV flow to RO. Normal LO (1-3cm), +AV flow. No FF in pelvis Normal abdominal u/s   Assessment: resolved RO cyst  Plan:  1. Lower abdominal pain D/w pt that unsure etiology of her pain, but I don't believe it's GYN in origin given that when she had pain, she had an u/s about a month later that showed an RO cyst and pain is unchanged  and cyst is now gone. When I scanned her initially she had a lot of urine in her bladder and she voided prior to her visit and she stated that she had to void again. I had her void and I scanned her and the bladder had a volume of 100cc^3 which seems normal. I told her that if her s/s persist I'd recommend contacting her PCP for a GI referral to see if there could be a cause from that.  - Urine Culture - Beta  hCG quant (ref lab)  2. Right ovarian cyst resolved  3. Screen for STD (sexually transmitted disease) Pt desired screening.  - HIV antibody (with reflex) - Hepatitis B Surface AntiGEN - RPR - Hepatitis C Antibody - Cytology - PAP  Orders Placed This Encounter  Procedures  . Urine Culture  . HIV antibody (with reflex)  . Hepatitis B Surface AntiGEN  . RPR  . Hepatitis C Antibody  . Beta hCG quant (ref lab)    RTC PRN  Cornelia Copa MD Attending Center [facility])

## 2017-12-19 ENCOUNTER — Encounter: Payer: Self-pay | Admitting: Obstetrics and Gynecology

## 2017-12-19 LAB — HEPATITIS C ANTIBODY

## 2017-12-19 LAB — CYTOLOGY - PAP: Diagnosis: NEGATIVE

## 2017-12-19 LAB — RPR: RPR Ser Ql: NONREACTIVE

## 2017-12-19 LAB — HEPATITIS B SURFACE ANTIGEN: Hepatitis B Surface Ag: NEGATIVE

## 2017-12-19 LAB — URINE CULTURE: ORGANISM ID, BACTERIA: NO GROWTH

## 2017-12-19 LAB — BETA HCG QUANT (REF LAB): hCG Quant: <1 m[IU]/mL

## 2017-12-19 LAB — HIV ANTIBODY (ROUTINE TESTING W REFLEX): HIV SCREEN 4TH GENERATION: NONREACTIVE

## 2017-12-19 NOTE — Procedures (Signed)
Transvaginal Ultrasound Note  Uterus Midplane, 8.1 x 2.98 x 4cm Endometrial stripe: 1.845mm, uniform, normal appearing Right ovary 2.45 x 1.03 x 0.93cm with 4-5 follicles noted Left ovary 1.9 x 1.83 x 1cm with 3-5 follicles noted No free fluid in the pelvis  Bladder incidentally looks normal but is full. Patient voided again and transabdominal ultrasound had bladder volume of 100cc^3  Normal pelvic ultrasound

## 2017-12-21 ENCOUNTER — Telehealth: Payer: Self-pay | Admitting: Family Medicine

## 2017-12-21 NOTE — Telephone Encounter (Signed)
plz call - I reviewed note from OBGYN from this week who said right ovarian cyst had resolved. Unclear cause of pain. Is she still having abd pain? If so, recommend either return for f/u with PCP or I can place GI referral if she'd prefer.

## 2017-12-23 NOTE — Telephone Encounter (Signed)
Left message on vm per dpr relaying Dr. G's message.  

## 2017-12-23 NOTE — Telephone Encounter (Deleted)
plz call  

## 2017-12-24 NOTE — Telephone Encounter (Signed)
Left message on vm per dpr relaying Dr. Timoteo ExposeG's message.     Mailing a letter.

## 2017-12-25 ENCOUNTER — Encounter: Payer: BLUE CROSS/BLUE SHIELD | Admitting: Family Medicine

## 2018-01-22 ENCOUNTER — Encounter: Payer: Self-pay | Admitting: Family Medicine

## 2018-01-22 ENCOUNTER — Ambulatory Visit (INDEPENDENT_AMBULATORY_CARE_PROVIDER_SITE_OTHER): Payer: BLUE CROSS/BLUE SHIELD | Admitting: Family Medicine

## 2018-01-22 VITALS — BP 120/80 | HR 75 | Temp 98.6°F | Ht 64.0 in | Wt 169.0 lb

## 2018-01-22 DIAGNOSIS — N898 Other specified noninflammatory disorders of vagina: Secondary | ICD-10-CM | POA: Diagnosis not present

## 2018-01-22 MED ORDER — FLUCONAZOLE 150 MG PO TABS: 150.0000 mg | ORAL_TABLET | Freq: Once | ORAL | 0 refills | Status: AC

## 2018-01-22 NOTE — Patient Instructions (Signed)
Good to see you today  I have sent in an oral tablet for vaginal yeast and will notify you of lab test.   If not better or any worsening, please get in touch.

## 2018-01-22 NOTE — Progress Notes (Signed)
   Subjective:    Patient ID: Karen Ellison, female    DOB: [birth year], 22 y.o.   MRN: [id number]  HPI This is a 22 yo female who presents today with vaginal itching and burning. Small amount white, mucoid discharge. She has used a different body wash. Has applied Monistat externally with some temporary relief, but not complete resolution. Started menses today. No dysuria, no hematuria, no frequency, no fever or chills. Some pain with attempting intercourse with irriation.  She got married about 10 days ago. Still taking OCPs.   Past Medical History:  Diagnosis Date  . Depression    No past surgical history on file. Family History  Problem Relation Age of Onset  . Miscarriages / [place] Mother   . Hypertension Father   . Drug abuse Sister   . Cancer Paternal Aunt   . Cancer Paternal Uncle   . Cancer Maternal Grandmother    Social History   Tobacco Use  . Smoking status: Never Smoker  . Smokeless tobacco: Never Used  Substance Use Topics  . Alcohol use: Yes    Comment: occ  . Drug use: No      Review of Systems Per HPI    Objective:   Physical Exam Physical Exam  Vitals reviewed. Constitutional: Oriented to person, place, and time. Appears well-developed and well-nourished.  HENT:  Head: Normocephalic and atraumatic.  Eyes: Conjunctivae are normal.  Neck: Normal range of motion. Neck supple.  Cardiovascular: Normal rate.   Pulmonary/Chest: Effort normal.  Musculoskeletal: Normal range of motion.  Neurological: Alert and oriented to person, place, and time.  Psychiatric: Normal mood and affect. Behavior is normal. Judgment and thought content normal.   Patient self collected Wet Prep  Wt Readings from Last 3 Encounters:  12/18/17 169 lb 12.8 oz (77 kg)  11/19/17 168 lb 8 oz (76.4 kg)  06/03/17 168 lb (76.2 kg)       Assessment & Plan:  1. Vaginal discharge - given temporary relief with monostat will treat for yeast while awaiting wet prep - given  RTC precautions - fluconazole (DIFLUCAN) 150 MG tablet; Take 1 tablet (150 mg total) by mouth once for 1 dose. Repeat if needed in 48-72 hours.  Dispense: 2 tablet; Refill: 0 - WET PREP BY MOLECULAR PROBE   Olean Ree, FNP-BC  [facility], [facility]  01/22/2018 10:37 AM

## 2018-01-23 LAB — WET PREP BY MOLECULAR PROBE
Candida species: NOT DETECTED
MICRO NUMBER: 91088176
SPECIMEN QUALITY:: ADEQUATE
TRICHOMONAS VAG: NOT DETECTED

## 2018-01-24 ENCOUNTER — Telehealth: Payer: Self-pay | Admitting: Family Medicine

## 2018-01-24 ENCOUNTER — Encounter: Payer: Self-pay | Admitting: Family Medicine

## 2018-01-24 ENCOUNTER — Other Ambulatory Visit: Payer: Self-pay | Admitting: Family Medicine

## 2018-01-24 DIAGNOSIS — N760 Acute vaginitis: Principal | ICD-10-CM

## 2018-01-24 DIAGNOSIS — B9689 Other specified bacterial agents as the cause of diseases classified elsewhere: Secondary | ICD-10-CM

## 2018-01-24 MED ORDER — METRONIDAZOLE 500 MG PO TABS: 500.0000 mg | ORAL_TABLET | Freq: Two times a day (BID) | ORAL | 0 refills | Status: DC

## 2018-01-24 NOTE — Telephone Encounter (Signed)
Resolved through [employer] Insurancemychart communication.

## 2018-01-24 NOTE — Telephone Encounter (Signed)
Copied from CRM [id number]. Topic: Quick Communication - See Telephone Encounter >> Jan 24, 2018 12:19 PM Cattie NevinWilliams, Candice N wrote: CRM for notification. See Telephone encounter for: 01/24/18.   Pt called stating that she has been in contact with [name] but has not gotten a response in a while. Pt states that she would prefer to take the medication for 7 days. Please advise.

## 2018-01-28 ENCOUNTER — Other Ambulatory Visit: Payer: Self-pay | Admitting: Family Medicine

## 2018-01-28 DIAGNOSIS — B9689 Other specified bacterial agents as the cause of diseases classified elsewhere: Secondary | ICD-10-CM

## 2018-01-28 DIAGNOSIS — N760 Acute vaginitis: Principal | ICD-10-CM

## 2018-01-28 MED ORDER — METRONIDAZOLE 0.75 % VA GEL: 1.0000 | Freq: Every day | VAGINAL | 0 refills | Status: DC

## 2018-01-31 ENCOUNTER — Encounter (HOSPITAL_COMMUNITY): Payer: Self-pay | Admitting: Emergency Medicine

## 2018-01-31 ENCOUNTER — Ambulatory Visit: Payer: Self-pay | Admitting: Family Medicine

## 2018-01-31 ENCOUNTER — Emergency Department (HOSPITAL_COMMUNITY)
Admission: EM | Admit: 2018-01-31 | Discharge: 2018-02-01 | Disposition: A | Discharge status: Home / Self Care | Payer: BLUE CROSS/BLUE SHIELD | Attending: Emergency Medicine | Admitting: Emergency Medicine

## 2018-01-31 ENCOUNTER — Other Ambulatory Visit: Payer: Self-pay

## 2018-01-31 DIAGNOSIS — R569 Unspecified convulsions: Secondary | ICD-10-CM | POA: Diagnosis not present

## 2018-01-31 DIAGNOSIS — R55 Syncope and collapse: Secondary | ICD-10-CM | POA: Diagnosis present

## 2018-01-31 DIAGNOSIS — Z79899 Other long term (current) drug therapy: Secondary | ICD-10-CM | POA: Insufficient documentation

## 2018-01-31 HISTORY — DX: Other specified bacterial agents as the cause of diseases classified elsewhere: B96.89

## 2018-01-31 HISTORY — DX: Other specified bacterial agents as the cause of diseases classified elsewhere: N76.0

## 2018-01-31 LAB — CBC WITH DIFFERENTIAL/PLATELET
ABS IMMATURE GRANULOCYTES: 0 10*3/uL (ref 0.0–0.1)
Basophils Absolute: 0 10*3/uL (ref 0.0–0.1)
Basophils Relative: 0 %
EOS PCT: 0 %
Eosinophils Absolute: 0 10*3/uL (ref 0.0–0.7)
HEMATOCRIT: 39.2 % (ref 36.0–46.0)
HEMOGLOBIN: 12.3 g/dL (ref 12.0–15.0)
Immature Granulocytes: 0 %
Lymphocytes Relative: 15 %
Lymphs Abs: 1.6 10*3/uL (ref 0.7–4.0)
MCH: 27.4 pg (ref 26.0–34.0)
MCHC: 31.4 g/dL (ref 30.0–36.0)
MCV: 87.3 fL (ref 78.0–100.0)
MONO ABS: 0.6 10*3/uL (ref 0.1–1.0)
MONOS PCT: 6 %
NEUTROS PCT: 79 %
Neutro Abs: 8.6 10*3/uL — ABNORMAL HIGH (ref 1.7–7.7)
Platelets: 263 10*3/uL (ref 150–400)
RBC: 4.49 MIL/uL (ref 3.87–5.11)
RDW: 13.6 % (ref 11.5–15.5)
WBC: 10.8 10*3/uL — ABNORMAL HIGH (ref 4.0–10.5)

## 2018-01-31 LAB — URINALYSIS, ROUTINE W REFLEX MICROSCOPIC
Bilirubin Urine: NEGATIVE
GLUCOSE, UA: NEGATIVE mg/dL
Hgb urine dipstick: NEGATIVE
Ketones, ur: 5 mg/dL — AB
LEUKOCYTES UA: NEGATIVE
NITRITE: NEGATIVE
PROTEIN: NEGATIVE mg/dL
Specific Gravity, Urine: 1.01 (ref 1.005–1.030)
pH: 7 (ref 5.0–8.0)

## 2018-01-31 LAB — BASIC METABOLIC PANEL
Anion gap: 9 (ref 5–15)
BUN: 6 mg/dL (ref 6–20)
CALCIUM: 9.1 mg/dL (ref 8.9–10.3)
CHLORIDE: 104 mmol/L (ref 98–111)
CO2: 25 mmol/L (ref 22–32)
Creatinine, Ser: 0.84 mg/dL (ref 0.44–1.00)
GFR calc Af Amer: >60 mL/min (ref >60)
GLUCOSE: 113 mg/dL — AB (ref 70–99)
POTASSIUM: 3.6 mmol/L (ref 3.5–5.1)
Sodium: 138 mmol/L (ref 135–145)

## 2018-01-31 LAB — I-STAT BETA HCG BLOOD, ED (MC, WL, AP ONLY): I-stat hCG, quantitative: <5 m[IU]/mL (ref <5)

## 2018-01-31 NOTE — Telephone Encounter (Addendum)
Note [facility] Squ[facility]Jane [facility];I was unable to reach pt to verify going to ED. Per DPR should not speak with anyone else.FYI to Harlin Heys Gessner FNP. Pt called back and pt remembers sitting on couch; pt does not remember feeling like she was going to sleep but pt went to sleep and when woke up an hour later pt had vomitus on her and pt did not know why; pt was very confused about why she had vomitus on her and when did she go to sleep. Pt knew where she was and who she was. The confusion was focused on why vomitus was on pt and when did she go to sleep. Pt feels OK now and does not plan to go to ED at this time. If pt condition worsens might go to ED. Pt trying to understand what happened that caused her to vomit while asleep. FYI to Harlin Heys Gessner FNP.

## 2018-01-31 NOTE — Telephone Encounter (Signed)
Patient was Rx'd Metronidazole- patient could not tolerate pills and she was given the gel. Patient took nap- she woke with confusion and had vomited during sleep. Spoke with patient- she is going to go to ED for check.  Reason for Disposition . Headache or vomiting  Answer Assessment - Initial Assessment Questions 1. LEVEL OF CONSCIOUSNESS: "How is he (she, the patient) acting right now?" (e.g., alert-oriented, confused, lethargic, stuporous, comatose)     Alert and orented 2. ONSET: "When did the confusion start?"  (minutes, hours, days)     Patient woke up from nap with confusion and had vomited during sleep 3. PATTERN "Does this come and go, or has it been constant since it started?"  "Is it present now?"     Confusion is gone now 4. ALCOHOL or DRUGS: "Has he been drinking alcohol or taking any drugs?"      no 5. NARCOTIC MEDICATIONS: "Has he been receiving any narcotic medications?" (e.g., morphine, Vicodin)     no 6. CAUSE: "What do you think is causing the confusion?"      no 7. OTHER SYMPTOMS: "Are there any other symptoms?" (e.g., difficulty breathing, headache, fever, weakness)     Headache since started medication- daily, nausea even on the gel- patient was not nauseous today  Protocols used: CONFUSION - DELIRIUM-A-AH

## 2018-01-31 NOTE — ED Provider Notes (Signed)
Patient placed in Quick Look pathway, seen and evaluated   Chief Complaint: nausea, vomiting  HPI: Karen Ellison is a 22 y.o. female who presents to the ED with n/v. Pt started taking Flagyl 7 days ago for BV.  She took oral pills for 4 days and used vaginal cream for the last 3 days.  MD took her off oral medication due to severe nausea.  Pt reports continued nausea (not as bad as when on pills), headache, and generalized body aches.  Today she went home on her lunch break and thinks she may have had a seizure.  Woke up on the couch confused, bite to lip, and vomit on her.  No history of seizures.  ROS: GI: nausea and vomiting   Physical Exam:  BP (!) 132/96 (BP Location: Right Arm)   Pulse 98   Temp 98.4 F (36.9 C) (Oral)   Resp 16   Ht 5\' 4"  (1.626 m)   Wt 74.8 kg   LMP 01/22/2018   SpO2 97%   BMI 28.32 kg/m    Gen: No distress  Neuro: Awake and Alert  Skin: Warm and dry    Initiation of care has begun. The patient has been counseled on the process, plan, and necessity for staying for the completion/evaluation, and the remainder of the medical screening examination    Janne Napoleoneese, Hope M, NP 01/31/18 2024    Charlynne PanderYao, David Hsienta, MD 02/01/18 [phone]

## 2018-01-31 NOTE — ED Notes (Signed)
Pt given ice pack for her neck per request.

## 2018-01-31 NOTE — ED Notes (Signed)
Pt brought back to triage by tech first for reassessment.  Husband concerned that pt is asking the same questions repeatedly.  Pt asked him who fed the dog, what their dog's name is, and how they got to hospital.  Pt alert and oriented and answers all these questions correctly now.  Updated on wait for treatment room.

## 2018-01-31 NOTE — ED Triage Notes (Signed)
Pt started taking Flagyl 7 days ago for BV.  She took oral pills for 4 days and used vaginal cream for the last 3 days.  MD took her off oral medication due to severe nausea.  Pt reports continued nausea (not as bad as when on pills), headache, and generalized body aches.  Today she went home on her lunch break and thinks she may have had a seizure.  Woke up on the couch confused, bite to lip, and vomit on her.  No history of seizures.

## 2018-02-01 ENCOUNTER — Emergency Department (HOSPITAL_COMMUNITY): Payer: BLUE CROSS/BLUE SHIELD

## 2018-02-01 MED ORDER — DIAZEPAM 5 MG PO TABS
5.0000 mg | ORAL_TABLET | Freq: Once | ORAL | Status: AC
  Administered 2018-02-01: 5 mg via ORAL
  Filled 2018-02-01: qty 1

## 2018-02-01 NOTE — ED Notes (Signed)
Taken to CT at this time. 
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
## 2018-02-03 ENCOUNTER — Other Ambulatory Visit: Payer: Self-pay | Admitting: Family Medicine

## 2018-02-03 DIAGNOSIS — Z820 Family history of epilepsy and other diseases of the nervous system: Secondary | ICD-10-CM

## 2018-02-03 DIAGNOSIS — R569 Unspecified convulsions: Secondary | ICD-10-CM

## 2018-02-03 NOTE — Telephone Encounter (Signed)
Called and spoke with patient she states that she still has body pain, foggy emory. And fatigue. Patient would like a referral and [facility]

## 2018-02-03 NOTE — Telephone Encounter (Signed)
Called and left detailed message informing patient of message.

## 2018-02-03 NOTE — Telephone Encounter (Signed)
Please call and see how patient is feeling and whether or not she got a neurology appointment. If not, let me know and I will put in a referral. If she needs a referral, please ask if she has a preference to location for appointment [facility]

## 2018-02-03 NOTE — Telephone Encounter (Signed)
Please call her and let her know that I have placed referral to neurology. If not better in a couple of days or if worse, please schedule a follow up visit. sss

## 2018-02-13 ENCOUNTER — Encounter: Payer: Self-pay | Admitting: Family Medicine

## 2018-02-17 ENCOUNTER — Encounter: Payer: Self-pay | Admitting: Family Medicine

## 2018-02-17 ENCOUNTER — Telehealth: Payer: Self-pay

## 2018-02-17 NOTE — Telephone Encounter (Signed)
Attempted to reach patient. No answer. Left her a message on her voice mail that I was sending her a Mychart message. Message was sent to reiterate importance of follow up with neurology and that she is not to drive until cleared.

## 2018-02-17 NOTE — Telephone Encounter (Signed)
Attempted to call patient.  No answer.

## 2018-02-17 NOTE — Telephone Encounter (Signed)
I wanted to let you know update on the patient's referral to Neurologist. We have called several times and left messages, sent mychart message and mailed letter. As per 02/12/18 note from [facility] office, noted by Retta Diones, she spoke to patient and patient stated "she wanted to talk to her PCP before scheduling w/ us/". I called patient to talk to her about this but there was no answer and we have not been able to speak to patient since the referral has been put in originally. Thank Neta Mends, RMA

## 2018-02-17 NOTE — Telephone Encounter (Signed)
Called and left patient message that I was trying to get in touch with her regarding recent ER visit and will try her later today.

## 2018-06-06 ENCOUNTER — Telehealth: Payer: Self-pay | Admitting: Family Medicine

## 2018-06-06 DIAGNOSIS — Z3041 Encounter for surveillance of contraceptive pills: Secondary | ICD-10-CM

## 2018-06-06 MED ORDER — NORGESTIMATE-ETH ESTRADIOL 0.25-35 MG-MCG PO TABS: 1.0000 | ORAL_TABLET | Freq: Every day | ORAL | 0 refills | Status: DC

## 2018-06-06 NOTE — Telephone Encounter (Signed)
Pt need refill for tri-previfem    Sent to [facility]  Pt has 6 days left

## 2018-06-06 NOTE — Telephone Encounter (Signed)
See history No recent CPE Last office visit 01/22/18 Last refill 07/12/17 #3/1

## 2018-06-06 NOTE — Telephone Encounter (Signed)
Please notify patient that I sent in a 1 month supply for her birth control, she will need to get in with Debbie for CPE for further refills.  Please schedule.

## 2018-06-13 MED ORDER — NORGESTIM-ETH ESTRAD TRIPHASIC 0.18/0.215/0.25 MG-35 MCG PO TABS: 1.0000 | ORAL_TABLET | Freq: Every day | ORAL | 1 refills | Status: DC

## 2018-06-13 NOTE — Telephone Encounter (Signed)
Patient came in today stating that bc pill rx sent in on 1/24 was incorrect and that the pharmacy has tried reaching out to our office to clarify.  Patient brought in her last pack to show me what she has been taking which is the TRI-prevafim formulation and not the prior combination that was sent in before.  I spoke directly with the [facility]ist at [facility] to confirm that Tri-Previfem as I re-ordered is correct.  #1pack with 1 refill.  Will copy PCP on this as well.   Patient has appointment scheduled with D.Gessner, NP on 06/30/18.

## 2018-06-13 NOTE — Telephone Encounter (Signed)
Noted  

## 2018-06-30 ENCOUNTER — Ambulatory Visit (INDEPENDENT_AMBULATORY_CARE_PROVIDER_SITE_OTHER): Payer: BLUE CROSS/BLUE SHIELD | Admitting: Family Medicine

## 2018-06-30 ENCOUNTER — Encounter: Payer: Self-pay | Admitting: Family Medicine

## 2018-06-30 VITALS — BP 102/78 | HR 82 | Temp 97.9°F | Ht 64.0 in | Wt 178.1 lb

## 2018-06-30 DIAGNOSIS — Z23 Encounter for immunization: Secondary | ICD-10-CM | POA: Diagnosis not present

## 2018-06-30 DIAGNOSIS — L290 Pruritus ani: Secondary | ICD-10-CM

## 2018-06-30 DIAGNOSIS — Z9189 Other specified personal risk factors, not elsewhere classified: Secondary | ICD-10-CM

## 2018-06-30 DIAGNOSIS — Z0000 Encounter for general adult medical examination without abnormal findings: Secondary | ICD-10-CM | POA: Diagnosis not present

## 2018-06-30 DIAGNOSIS — Z2839 Other underimmunization status: Secondary | ICD-10-CM

## 2018-06-30 MED ORDER — NORGESTIM-ETH ESTRAD TRIPHASIC 0.18/0.215/0.25 MG-35 MCG PO TABS: 1.0000 | ORAL_TABLET | Freq: Every day | ORAL | 4 refills | Status: DC

## 2018-06-30 NOTE — Progress Notes (Signed)
Subjective:    Patient ID: Karen Ellison, female    DOB: 02/20/1996, 23 y.o.   MRN: [id number]  HPI This is a 23 yo female who presents today for CPE.   Last CPE- gyn Pap- 01/18/18 Tdap- she had never had, will have today Flu- declines Eye- regular, wears glasses Dental- regular  Seizure like activity- occurred 01/31/18, never followed up with neurology, no further symptoms  Past Medical History:  Diagnosis Date  . Bacterial vaginosis   . Depression    No past surgical history on file. Family History  Problem Relation Age of Onset  . Miscarriages / [place] Mother   . Hypertension Father   . Drug abuse Sister   . Cancer Paternal Aunt   . Cancer Paternal Uncle   . Cancer Maternal Grandmother    Social History   Tobacco Use  . Smoking status: Never Smoker  . Smokeless tobacco: Never Used  Substance Use Topics  . Alcohol use: Yes    Comment: occ  . Drug use: No     Review of Systems  Constitutional: Negative.   HENT: Negative.   Eyes: Negative.   Respiratory: Negative.   Cardiovascular: Negative.   Gastrointestinal: Negative for abdominal pain, anal bleeding, blood in stool, constipation and diarrhea.       Persistent anal itching, some temp improvement with hemorrhoid cream  Skin: Negative.   Allergic/Immunologic: Negative.   Neurological: Negative for seizures.  Hematological: Negative.   Psychiatric/Behavioral: Negative.        Objective:   Physical Exam Constitutional:      General: She is not in acute distress.    Appearance: Normal appearance. She is obese. She is not ill-appearing, toxic-appearing or diaphoretic.  HENT:     Head: Normocephalic and atraumatic.     Right Ear: Tympanic membrane and ear canal normal.     Left Ear: Tympanic membrane and ear canal normal.     Nose: Nose normal.     Mouth/Throat:     Mouth: Mucous membranes are moist.     Pharynx: Oropharynx is clear.  Eyes:     Conjunctiva/sclera: Conjunctivae normal.  Neck:      Musculoskeletal: Normal range of motion and neck supple. No neck rigidity or muscular tenderness.  Cardiovascular:     Rate and Rhythm: Normal rate and regular rhythm.     Heart sounds: Normal heart sounds.  Pulmonary:     Effort: Pulmonary effort is normal.     Breath sounds: Normal breath sounds.  Abdominal:     General: Abdomen is flat. Bowel sounds are normal. There is no distension.     Palpations: There is no mass.     Tenderness: There is no abdominal tenderness. There is no guarding or rebound.     Hernia: No hernia is present.  Musculoskeletal: Normal range of motion.     Right lower leg: No edema.     Left lower leg: No edema.  Lymphadenopathy:     Cervical: No cervical adenopathy.  Skin:    General: Skin is warm and dry.  Neurological:     Mental Status: She is alert and oriented to person, place, and time.  Psychiatric:        Mood and Affect: Mood normal.        Behavior: Behavior normal.        Thought Content: Thought content normal.        Judgment: Judgment normal.       BP 102/78  Pulse 82   Temp 97.9 F (36.6 C) (Oral)   Ht 5\' 4"  (1.626 m)   Wt 178 lb 1.9 oz (80.8 kg)   SpO2 96%   BMI 30.57 kg/m  Wt Readings from Last 3 Encounters:  06/30/18 178 lb 1.9 oz (80.8 kg)  01/31/18 165 lb (74.8 kg)  01/22/18 169 lb (76.7 kg)   Depression screen [facility] 2/9 06/30/2018 02/18/2017 02/18/2017  Decreased Interest 0 0 0  Down, Depressed, Hopeless 0 1 1  PHQ - 2 Score 0 1 1  Altered sleeping - 0 -  Tired, decreased energy - 1 -  Change in appetite - 0 -  Feeling bad or failure about yourself  - 1 -  Trouble concentrating - 1 -  Moving slowly or fidgety/restless - 0 -  Suicidal thoughts - 0 -  PHQ-9 Score - 4 -       Assessment & Plan:  1. Annual physical exam - Discussed and encouraged healthy lifestyle choices- adequate sleep, regular exercise, stress management and healthy food choices.   2. Incomplete immunization status - discussed need for  immunization and provided her with written recommendations to get her up to date  3. Anal itching - provided symptomatic relief recommendations  4. Need for Tdap vaccination - Tdap vaccine greater than or equal to 7yo IM  - follow up in 1 year  Olean Ree, FNP-BC  [facility], [facility]  07/06/2018 4:16 PM

## 2018-06-30 NOTE — Patient Instructions (Addendum)
Look at any triggers- cola, tea, coffee, chocolate, citrus, tomatoes  Apply a barrier cream (diaper) of zinc oxide  Take 2 months of a probiotic  Consider the following immunizations- MMR Hep A Hep B HPV Let me know if you would like and we can schedule a nurse only visit or you can get at your pharmacy or health department  Health Maintenance, Female Adopting a healthy lifestyle and getting preventive care can go a long way to promote health and wellness. Talk with your health care provider about what schedule of regular examinations is right for you. This is a good chance for you to check in with your provider about disease prevention and staying healthy. In between checkups, there are plenty of things you can do on your own. Experts have done a lot of research about which lifestyle changes and preventive measures are most likely to keep you healthy. Ask your health care provider for more information. Weight and diet Eat a healthy diet  Be sure to include plenty of vegetables, fruits, low-fat dairy products, and lean protein.  Do not eat a lot of foods high in solid fats, added sugars, or salt.  Get regular exercise. This is one of the most important things you can do for your health. ? Most adults should exercise for at least 150 minutes each week. The exercise should increase your heart rate and make you sweat (moderate-intensity exercise). ? Most adults should also do strengthening exercises at least twice a week. This is in addition to the moderate-intensity exercise. Maintain a healthy weight  Body mass index (BMI) is a measurement that can be used to identify possible weight problems. It estimates body fat based on height and weight. Your health care provider can help determine your BMI and help you achieve or maintain a healthy weight.  For females 20 years of age and older: ? A BMI below 18.5 is considered underweight. ? A BMI of 18.5 to 24.9 is normal. ? A BMI of 25 to 29.9  is considered overweight. ? A BMI of 30 and above is considered obese. Watch levels of cholesterol and blood lipids  You should start having your blood tested for lipids and cholesterol at 25 years of age, then have this test every 5 years.  You may need to have your cholesterol levels checked more often if: ? Your lipid or cholesterol levels are high. ? You are older than 25 years of age. ? You are at high risk for heart disease. Cancer screening Lung Cancer  Lung cancer screening is recommended for adults 41-53 years old who are at high risk for lung cancer because of a history of smoking.  A yearly low-dose CT scan of the lungs is recommended for people who: ? Currently smoke. ? Have quit within the past 15 years. ? Have at least a 30-pack-year history of smoking. A pack year is smoking an average of one pack of cigarettes a day for 1 year.  Yearly screening should continue until it has been 15 years since you quit.  Yearly screening should stop if you develop a health problem that would prevent you from having lung cancer treatment. Breast Cancer  Practice breast self-awareness. This means understanding how your breasts normally appear and feel.  It also means doing regular breast self-exams. Let your health care provider know about any changes, no matter how small.  If you are in your 20s or 30s, you should have a clinical breast exam (CBE) by a health  care provider every 1-3 years as part of a regular health exam.  If you are 54 or older, have a CBE every year. Also consider having a breast X-ray (mammogram) every year.  If you have a family history of breast cancer, talk to your health care provider about genetic screening.  If you are at high risk for breast cancer, talk to your health care provider about having an MRI and a mammogram every year.  Breast cancer gene (BRCA) assessment is recommended for women who have family members with BRCA-related cancers. BRCA-related  cancers include: ? Breast. ? Ovarian. ? Tubal. ? Peritoneal cancers.  Results of the assessment will determine the need for genetic counseling and BRCA1 and BRCA2 testing. Cervical Cancer Your health care provider may recommend that you be screened regularly for cancer of the pelvic organs (ovaries, uterus, and vagina). This screening involves a pelvic examination, including checking for microscopic changes to the surface of your cervix (Pap test). You may be encouraged to have this screening done every 3 years, beginning at age 39.  For women ages 54-65, health care providers may recommend pelvic exams and Pap testing every 3 years, or they may recommend the Pap and pelvic exam, combined with testing for human papilloma virus (HPV), every 5 years. Some types of HPV increase your risk of cervical cancer. Testing for HPV may also be done on women of any age with unclear Pap test results.  Other health care providers may not recommend any screening for nonpregnant women who are considered low risk for pelvic cancer and who do not have symptoms. Ask your health care provider if a screening pelvic exam is right for you.  If you have had past treatment for cervical cancer or a condition that could lead to cancer, you need Pap tests and screening for cancer for at least 20 years after your treatment. If Pap tests have been discontinued, your risk factors (such as having a new sexual partner) need to be reassessed to determine if screening should resume. Some women have medical problems that increase the chance of getting cervical cancer. In these cases, your health care provider may recommend more frequent screening and Pap tests. Colorectal Cancer  This type of cancer can be detected and often prevented.  Routine colorectal cancer screening usually begins at 25 years of age and continues through 25 years of age.  Your health care provider may recommend screening at an earlier age if you have risk  factors for colon cancer.  Your health care provider may also recommend using home test kits to check for hidden blood in the stool.  A small camera at the end of a tube can be used to examine your colon directly (sigmoidoscopy or colonoscopy). This is done to check for the earliest forms of colorectal cancer.  Routine screening usually begins at age 53.  Direct examination of the colon should be repeated every 5-10 years through 25 years of age. However, you may need to be screened more often if early forms of precancerous polyps or small growths are found. Skin Cancer  Check your skin from head to toe regularly.  Tell your health care provider about any new moles or changes in moles, especially if there is a change in a mole's shape or color.  Also tell your health care provider if you have a mole that is larger than the size of a pencil eraser.  Always use sunscreen. Apply sunscreen liberally and repeatedly throughout the day.  Protect  yourself by wearing long sleeves, pants, a wide-brimmed hat, and sunglasses whenever you are outside. Heart disease, diabetes, and high blood pressure  High blood pressure causes heart disease and increases the risk of stroke. High blood pressure is more likely to develop in: ? People who have blood pressure in the high end of the normal range (130-139/85-89 mm Hg). ? People who are overweight or obese. ? People who are African American.  If you are 73-80 years of age, have your blood pressure checked every 3-5 years. If you are 37 years of age or older, have your blood pressure checked every year. You should have your blood pressure measured twice-once when you are at a hospital or clinic, and once when you are not at a hospital or clinic. Record the average of the two measurements. To check your blood pressure when you are not at a hospital or clinic, you can use: ? An automated blood pressure machine at a pharmacy. ? A home blood pressure  monitor.  If you are between 48 years and 24 years old, ask your health care provider if you should take aspirin to prevent strokes.  Have regular diabetes screenings. This involves taking a blood sample to check your fasting blood sugar level. ? If you are at a normal weight and have a low risk for diabetes, have this test once every three years after 25 years of age. ? If you are overweight and have a high risk for diabetes, consider being tested at a younger age or more often. Preventing infection Hepatitis B  If you have a higher risk for hepatitis B, you should be screened for this virus. You are considered at high risk for hepatitis B if: ? You were born in a country where hepatitis B is common. Ask your health care provider which countries are considered high risk. ? Your parents were born in a high-risk country, and you have not been immunized against hepatitis B (hepatitis B vaccine). ? You have HIV or AIDS. ? You use needles to inject street drugs. ? You live with someone who has hepatitis B. ? You have had sex with someone who has hepatitis B. ? You get hemodialysis treatment. ? You take certain medicines for conditions, including cancer, organ transplantation, and autoimmune conditions. Hepatitis C  Blood testing is recommended for: ? Everyone born from [birth year] through 1967. ? Anyone with known risk factors for hepatitis C. Sexually transmitted infections (STIs)  You should be screened for sexually transmitted infections (STIs) including gonorrhea and chlamydia if: ? You are sexually active and are younger than 25 years of age. ? You are older than 25 years of age and your health care provider tells you that you are at risk for this type of infection. ? Your sexual activity has changed since you were last screened and you are at an increased risk for chlamydia or gonorrhea. Ask your health care provider if you are at risk.  If you do not have HIV, but are at risk, it may be  recommended that you take a prescription medicine daily to prevent HIV infection. This is called pre-exposure prophylaxis (PrEP). You are considered at risk if: ? You are sexually active and do not regularly use condoms or know the HIV status of your partner(s). ? You take drugs by injection. ? You are sexually active with a partner who has HIV. Talk with your health care provider about whether you are at high risk of being infected with HIV. If  you choose to begin PrEP, you should first be tested for HIV. You should then be tested every 3 months for as long as you are taking PrEP. Pregnancy  If you are premenopausal and you may become pregnant, ask your health care provider about preconception counseling.  If you may become pregnant, take 400 to 800 micrograms (mcg) of folic acid every day.  If you want to prevent pregnancy, talk to your health care provider about birth control (contraception). Osteoporosis and menopause  Osteoporosis is a disease in which the bones lose minerals and strength with aging. This can result in serious bone fractures. Your risk for osteoporosis can be identified using a bone density scan.  If you are 67 years of age or older, or if you are at risk for osteoporosis and fractures, ask your health care provider if you should be screened.  Ask your health care provider whether you should take a calcium or vitamin D supplement to lower your risk for osteoporosis.  Menopause may have certain physical symptoms and risks.  Hormone replacement therapy may reduce some of these symptoms and risks. Talk to your health care provider about whether hormone replacement therapy is right for you. Follow these instructions at home:  Schedule regular health, dental, and eye exams.  Stay current with your immunizations.  Do not use any tobacco products including cigarettes, chewing tobacco, or electronic cigarettes.  If you are pregnant, do not drink alcohol.  If you are  breastfeeding, limit how much and how often you drink alcohol.  Limit alcohol intake to no more than 1 drink per day for nonpregnant women. One drink equals 12 ounces of beer, 5 ounces of wine, or 1 ounces of hard liquor.  Do not use street drugs.  Do not share needles.  Ask your health care provider for help if you need support or information about quitting drugs.  Tell your health care provider if you often feel depressed.  Tell your health care provider if you have ever been abused or do not feel safe at home. This information is not intended to replace advice given to you by your health care provider. Make sure you discuss any questions you have with your health care provider. Document Released: 11/13/2010 Document Revised: 10/06/2015 Document Reviewed: 02/01/2015 Elsevier Interactive Patient Education  2019 [employer].

## 2018-06-30 NOTE — Progress Notes (Signed)
Pre visit review using our clinic review tool, if applicable. No additional management support is needed unless otherwise documented below in the visit note. 

## 2018-07-06 ENCOUNTER — Encounter: Payer: Self-pay | Admitting: Family Medicine

## 2019-05-23 ENCOUNTER — Other Ambulatory Visit: Payer: Self-pay

## 2019-05-23 DIAGNOSIS — Z20822 Contact with and (suspected) exposure to covid-19: Secondary | ICD-10-CM | POA: Diagnosis not present

## 2019-05-24 LAB — NOVEL CORONAVIRUS, NAA: SARS-CoV-2, NAA: NOT DETECTED

## 2019-05-27 ENCOUNTER — Encounter: Payer: Self-pay | Admitting: Family Medicine

## 2019-05-27 NOTE — Telephone Encounter (Signed)
Please advise, thanks.

## 2019-05-28 ENCOUNTER — Encounter: Payer: Self-pay | Admitting: Family Medicine

## 2019-05-28 ENCOUNTER — Ambulatory Visit (INDEPENDENT_AMBULATORY_CARE_PROVIDER_SITE_OTHER): Payer: BC Managed Care – PPO | Admitting: Family Medicine

## 2019-05-28 ENCOUNTER — Other Ambulatory Visit: Payer: Self-pay

## 2019-05-28 DIAGNOSIS — J029 Acute pharyngitis, unspecified: Secondary | ICD-10-CM | POA: Diagnosis not present

## 2019-05-28 MED ORDER — AMOXICILLIN-POT CLAVULANATE 875-125 MG PO TABS: 1.0000 | ORAL_TABLET | Freq: Two times a day (BID) | ORAL | 0 refills | Status: DC

## 2019-05-28 NOTE — Progress Notes (Signed)
Virtual Visit via Video Note  I connected with Karen Ellison on 05/28/19 at  9:30 AM EST by a video enabled telemedicine application and verified that I am speaking with the correct person using two identifiers.  Location: Patient: home Provider: office    I discussed the limitations of evaluation and management by telemedicine and the availability of in person appointments. The patient expressed understanding and agreed to proceed.  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   She notes starting 1/13 evening throat hurts  Worse now  It became severe and hurts to swallow Headache -pressure under eyes is significant , also at temples  Pressing with hands relieve pressure She worked Monday through Wednesday and had to come home yesterday  pnd  Last fever was Sat am (highest temp was 101.3)   Last night runny nose and sneezing  Now more congested  All clear mucous   Has a little chest congestion  Cough is little to none -clear phlegm  No sob   No loss of taste or smell   No rash today   Had a doctor at work look at her throat yesterday-said it was red  No white d/c or exudate   OTC : mucinex DM  Sudafed  Ibuprofen - for fever and pain  Some cough drops   No GI symptoms  No diarrhea  Decreased appetite   Patient Active Problem List   Diagnosis Date Noted  . Acute pharyngitis 05/28/2019  . Right ovarian cyst 12/18/2017  . RLQ abdominal pain 11/21/2017   . Generalized abdominal pain 11/19/2017  . Vaginal burning 06/03/2017   Past Medical History:  Diagnosis Date  . Bacterial vaginosis   . Depression    History reviewed. No pertinent surgical history. Social History   Tobacco Use  . Smoking status: Never Smoker  . Smokeless tobacco: Never Used  Substance Use Topics  . Alcohol use: Yes    Comment: occ  . Drug use: No   Family History  Problem Relation Age of Onset  . Miscarriages / [place] Mother   . Hypertension Father   . Drug abuse Sister   . Cancer Paternal Aunt   . Cancer Paternal Uncle   . Cancer Maternal Grandmother    Allergies  Allergen Reactions  . Metrogel [Metronidazole] Other (See Comments)    Confusion and vomiting   Current Outpatient Medications on File Prior to Visit  Medication Sig Dispense Refill  . Norgestimate-Ethinyl Estradiol Triphasic (TRI-PREVIFEM) 0.18/0.215/0.25 MG-35 MCG tablet Take 1 tablet by mouth daily. 3 Package 4   No current facility-administered medications on file prior to visit.   Review of Systems  Constitutional: Positive for malaise/fatigue. Negative for chills, diaphoresis and fever.  HENT: Positive for congestion and sore throat. Negative for ear pain and sinus pain.   Eyes: Negative for blurred vision, discharge and redness.  Respiratory: Positive for  cough and sputum production. Negative for shortness of breath, wheezing and stridor.   Cardiovascular: Negative for chest pain, palpitations and leg swelling.  Gastrointestinal: Negative for abdominal pain, diarrhea, nausea and vomiting.  Musculoskeletal: Negative for myalgias.  Skin: Negative for rash.  Neurological: Positive for headaches. Negative for dizziness.      Observations/Objective: Patient appears well, in no distress Weight is baseline  No facial swelling or asymmetry Voice is slightly hoarse and congested sounding  No obvious tremor or mobility impairment Moving neck and UEs normally Able to hear the  call well  No cough or shortness of breath during interview  Talkative and mentally sharp with no cognitive changes No skin changes on face or neck , no rash or pallor Affect is normal    Assessment and Plan: Problem List Items Addressed This Visit      Respiratory   Acute pharyngitis    Pt does have some mild uri symptoms but ST is predominant  Neg covid test since she started getting sick  Red throat  No fever since Saturday  Cannot r/o strep throat  Sent in augmentin (would also cover bacterial sinusitis if that is an issue) Fluids/rest /sympt care (see avs)  Update if not starting to improve in a week or if worsening   Also if new symptoms Would re test for covid if no improvement or worse          Follow Up Instructions: Take ibuprofen and tylenol for sore throat Keep watching your temp  Drink lots of fluids / cold will feel good  Salt water gargles are ok   Chloraseptic products may help   Take augmentin as directed  Update if not starting to improve in a week or if worsening   If any new symptoms let us know    I discussed the assessment and treatment plan with the patient. The patient was provided an opportunity to ask questions and all were answered. The patient agreed with the plan and demonstrated an understanding of the instructions.   The patient was advised to call back or seek an in-person evaluation if the symptoms worsen or if the condition fails to improve as anticipated.  Roxy Manns, MD

## 2019-05-28 NOTE — Patient Instructions (Addendum)
Take ibuprofen and tylenol for sore throat Keep watching your temp  Drink lots of fluids / cold will feel good  Salt water gargles are ok   Chloraseptic products may help   Take augmentin as directed  Update if not starting to improve in a week or if worsening   If any new symptoms let us know

## 2019-05-28 NOTE — Assessment & Plan Note (Signed)
Pt does have some mild uri symptoms but ST is predominant  Neg covid test since she started getting sick  Red throat  No fever since Saturday  Cannot r/o strep throat  Sent in augmentin (would also cover bacterial sinusitis if that is an issue) Fluids/rest /sympt care (see avs)  Update if not starting to improve in a week or if worsening   Also if new symptoms Would re test for covid if no improvement or worse

## 2019-07-16 IMAGING — CT CT HEAD W/O CM
4 series · 15 of 47 positions shown, 17 images · non-contrast
Comparison: None.

CLINICAL DATA: Altered level of consciousness unexplained.

EXAM:
CT HEAD WITHOUT CONTRAST
TECHNIQUE: Contiguous axial images were obtained from the base of the skull
through the vertex without intravenous contrast.

[Series 3: head without · axial · non-contrast · 0.44mm/px · z∈[-64,+51]mm · 7 of 31 slices shown, 9 images]
[im 4/31  brain]
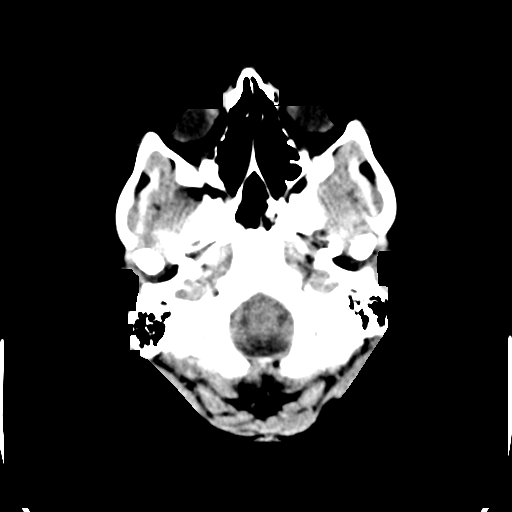
[im 4/31  bone]
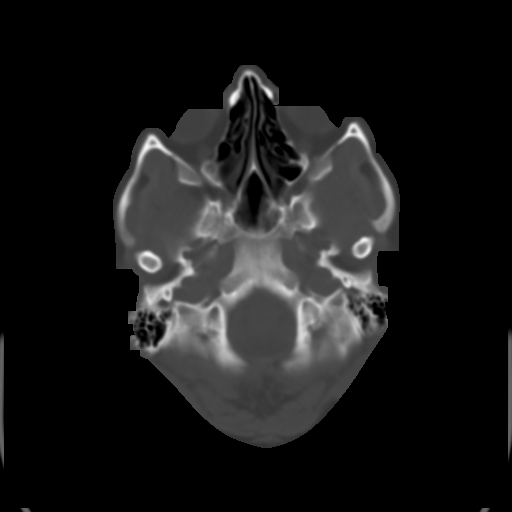
[im 8/31  brain]
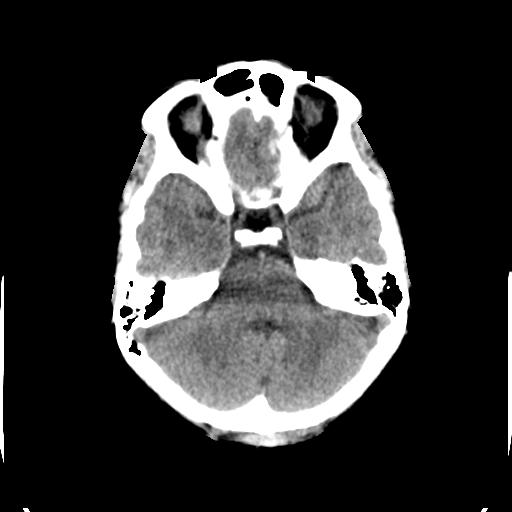
[im 12/31  brain]
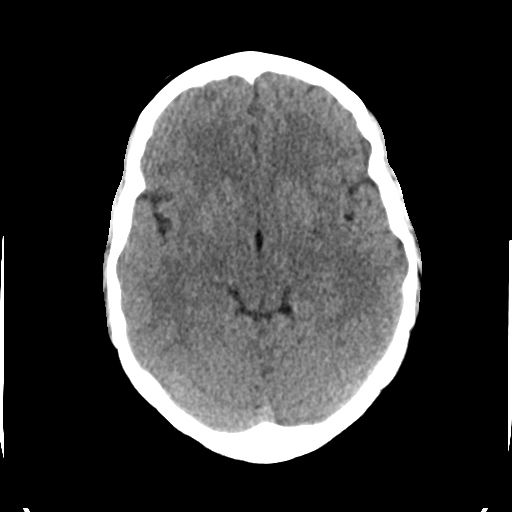
[im 16/31  brain]
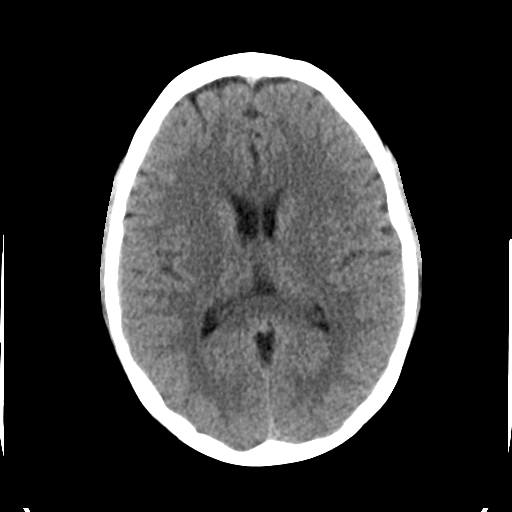
[im 19/31  brain]
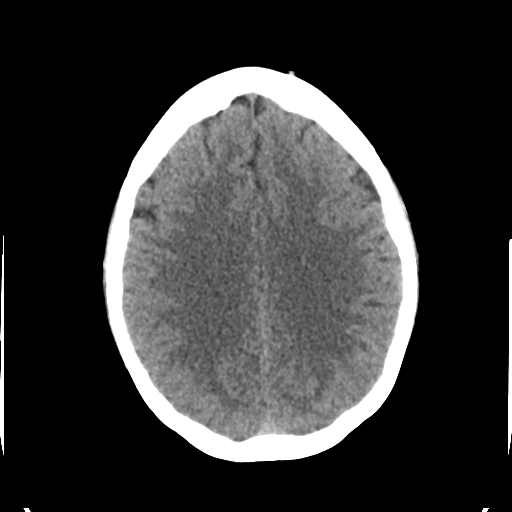
[im 19/31  bone]
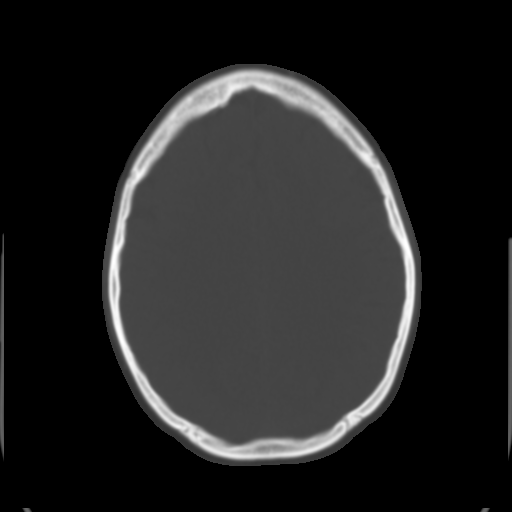
[im 23/31  brain]
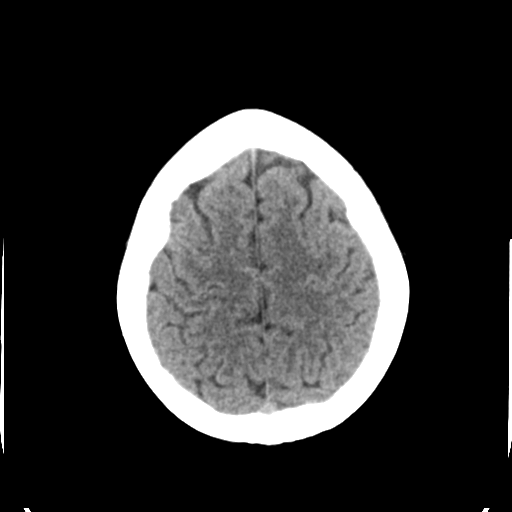
[im 27/31  brain]
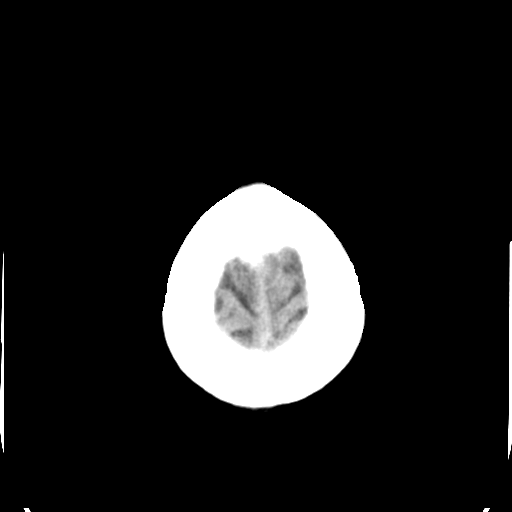

[Series 4: head bone · axial · 0.44mm/px · z∈[-65,-49]mm · 2 of 77 slices shown]
[im 8/77  bone]
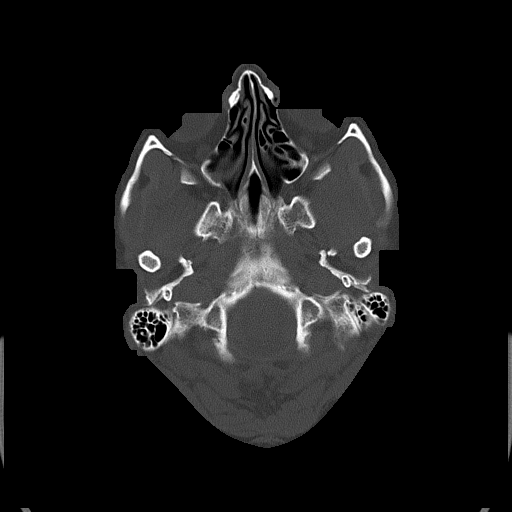
[im 16/77  bone]
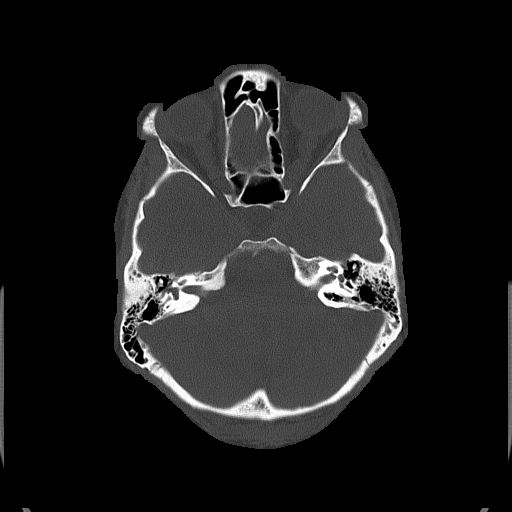

[Series 5: head without cor · coronal · non-contrast · 0.38mm/px · 3 of 72 slices shown]
[im 24/72  brain]
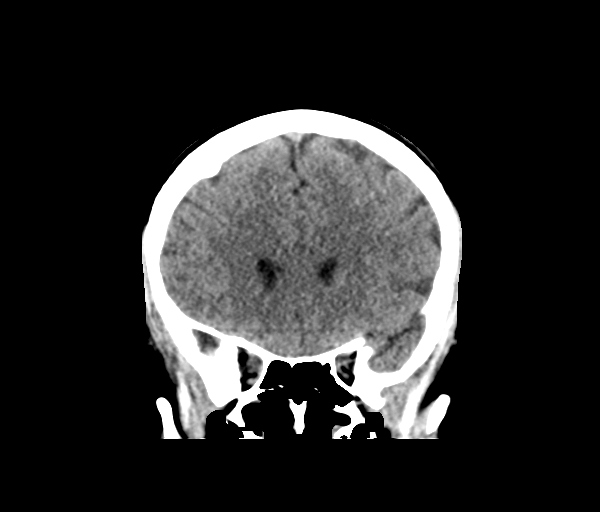
[im 32/72  brain]
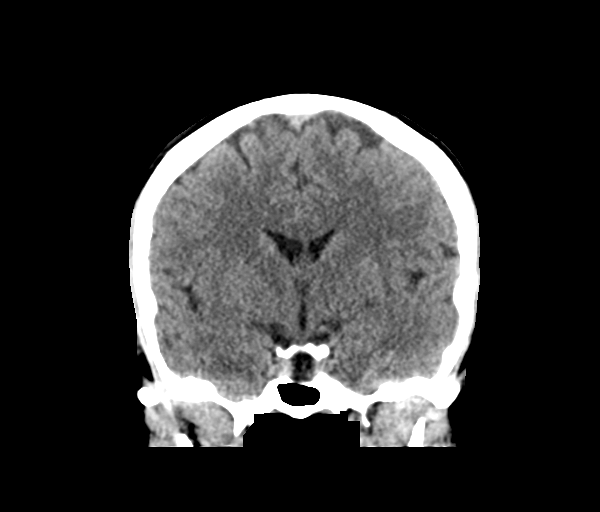
[im 40/72  brain]
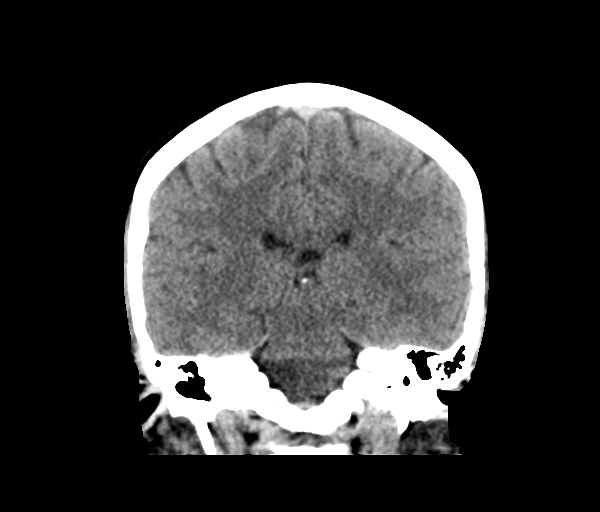

[Series 6: head without sag · sagittal · non-contrast · 0.30mm/px · 3 of 67 slices shown]
[im 23/67  brain]
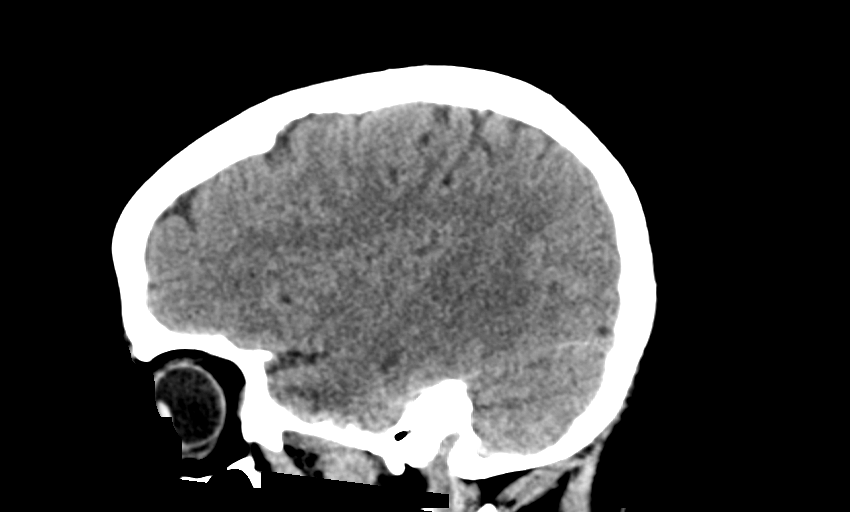
[im 34/67  brain]
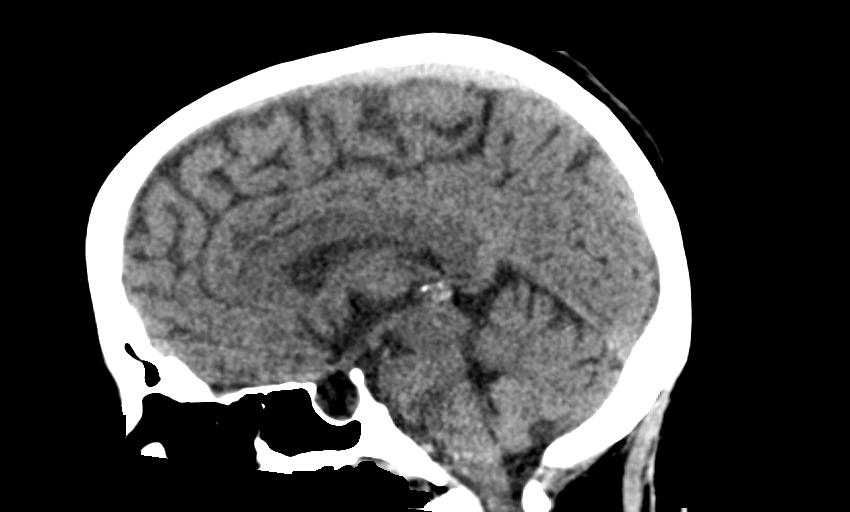
[im 45/67  brain]
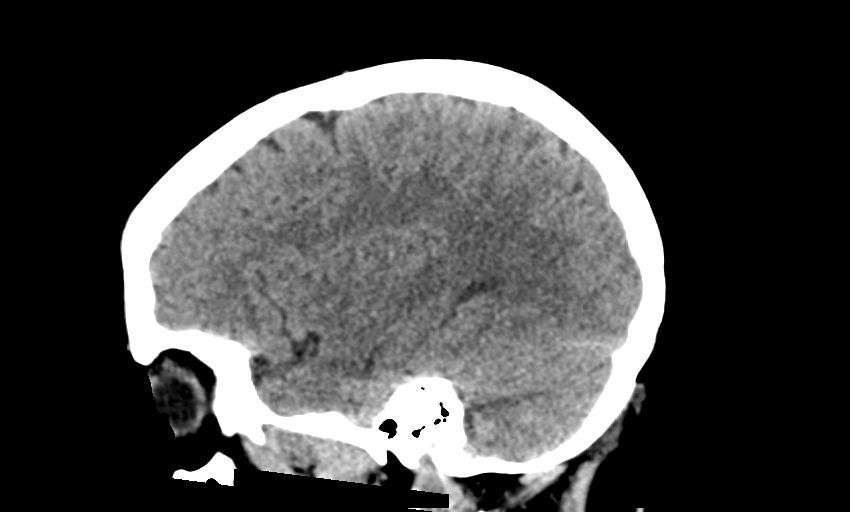

[15 of 47 positions shown; findings below may reference images not displayed]

FINDINGS: BRAIN: The ventricles and sulci are normal. No intraparenchymal
hemorrhage, mass effect nor midline shift. No acute large vascular
territory infarcts. Grey-white matter distinction is maintained. The
basal ganglia are unremarkable. No abnormal extra-axial fluid
collections. Basal cisterns are not effaced and midline. The
brainstem and cerebellar hemispheres are without acute
abnormalities.

VASCULAR: Unremarkable.

SKULL/SOFT TISSUES: No skull fracture. No significant soft tissue
swelling.

ORBITS/SINUSES: The included ocular globes and orbital contents are
normal.The mastoid air cells are clear. The included paranasal
sinuses are well-aerated.

OTHER: None.
IMPRESSION: Normal head CT

## 2019-08-15 ENCOUNTER — Other Ambulatory Visit: Payer: Self-pay | Admitting: Family Medicine

## 2019-08-17 NOTE — Telephone Encounter (Signed)
Rx was printed and faxed 

## 2019-08-17 NOTE — Telephone Encounter (Signed)
Patient was last seen 06/30/18 for CPE - with no upcoming appts. Rx was last refilled 06/30/18 for 3 packages with 4 refills.  Is this ok to refill?

## 2019-09-18 ENCOUNTER — Other Ambulatory Visit: Payer: Self-pay | Admitting: Family Medicine

## 2019-09-18 NOTE — Telephone Encounter (Signed)
Electronic refill request. [facility] Last office visit:   05/28/2019 [facility] Last Filled:     84 tablet 0 08/17/2019  Note attached at pharmacy to make appt prior to further refills.  No upcoming appts scheduled.

## 2019-11-07 ENCOUNTER — Other Ambulatory Visit: Payer: Self-pay | Admitting: Family Medicine

## 2019-11-18 ENCOUNTER — Encounter: Payer: Self-pay | Admitting: Family Medicine

## 2019-11-18 ENCOUNTER — Telehealth (INDEPENDENT_AMBULATORY_CARE_PROVIDER_SITE_OTHER): Payer: BC Managed Care – PPO | Admitting: Family Medicine

## 2019-11-18 VITALS — BP 127/82 | HR 60 | Temp 97.8°F

## 2019-11-18 DIAGNOSIS — G44219 Episodic tension-type headache, not intractable: Secondary | ICD-10-CM

## 2019-11-18 DIAGNOSIS — Z3041 Encounter for surveillance of contraceptive pills: Secondary | ICD-10-CM

## 2019-11-18 MED ORDER — NORGESTIM-ETH ESTRAD TRIPHASIC 0.18/0.215/0.25 MG-35 MCG PO TABS: 1.0000 | ORAL_TABLET | Freq: Every day | ORAL | 3 refills | Status: DC

## 2019-11-18 NOTE — Progress Notes (Signed)
Virtual Visit via Video Note  I connected with Karen Ellison on 11/18/19 at 12:00 PM EDT by a video enabled telemedicine application and verified that I am speaking with the correct person using two identifiers.  Location: Patient: At work Provider: [facility]- Mila Merry Persons participating in virtual visit: Patient and provider   I discussed the limitations of evaluation and management by telemedicine and the availability of in person appointments. The patient expressed understanding and agreed to proceed.  History of Present Illness: Chief Complaint  Patient presents with   Medication Refill    birth control   This is a 24 year old female who presents today for virtual visit for medication refill and to discuss headaches.  Oral contraceptives -patient has been on same prescription for several years.  She denies any problems or concerns.  She and her husband are thinking about waiting at least 1 more year prior to trying to have children.  Frequent headaches-has been having for about 6 months, were daily now about 3 times a week.  Start at back of neck or behind eyes.  She has not had any recent vision testing or exam.  Ibuprofen 800 mg helps.  She has occasional light sensitivity but no sound sensitivity.  She had 1 severe headache that was accompanied by nausea and vomiting last month.  Good water intake, drinks about 100 ounces a day.  Has 1 cup of coffee in the morning.  No known triggers.  She does endorse that her job is very high stress.  She works for a [employer] and has an adjustment and massage once a month.  Spinal manipulation helps temporarily with headaches.    Observations/Objective: Patient is alert and answers questions appropriately.  Visible skin is unremarkable.  Respirations are even and unlabored without increased work of breathing.  No audible wheeze or witnessed cough.  Mood and affect are appropriate. BP 127/82 (BP Location: Left Arm)    Pulse 60     Temp 97.8 F (36.6 C) (Temporal)    LMP 10/28/2019   Assessment and Plan: 1. Encounter for birth control pills maintenance -Discussed having her come in for well woman exam and she will schedule appointment - Norgestimate-Ethinyl Estradiol Triphasic (TRI-ESTARYLLA) 0.18/0.215/0.25 MG-35 MCG tablet; Take 1 tablet by mouth daily.  Dispense: 84 tablet; Refill: 3  2. Episodic tension-type headache, not intractable -Discussed possible sources of headaches including increased stress, vision strain, food triggers -I have recommended that she keep a headache log and get an eye exam -Encouraged her to follow-up if no improvement or worsening of headaches   Olean Ree, FNP-BC  [facility], [facility]  11/18/2019 12:34 PM   Follow Up Instructions:    I discussed the assessment and treatment plan with the patient. The patient was provided an opportunity to ask questions and all were answered. The patient agreed with the plan and demonstrated an understanding of the instructions.   The patient was advised to call back or seek an in-person evaluation if the symptoms worsen or if the condition fails to improve as anticipated.    Emi Belfast, FNP

## 2019-12-08 ENCOUNTER — Encounter: Payer: Self-pay | Admitting: Family Medicine

## 2020-01-21 DIAGNOSIS — H53143 Visual discomfort, bilateral: Secondary | ICD-10-CM | POA: Diagnosis not present

## 2020-04-22 ENCOUNTER — Ambulatory Visit: Payer: BC Managed Care – PPO | Admitting: Family Medicine

## 2020-05-05 ENCOUNTER — Telehealth: Payer: Self-pay

## 2020-05-05 DIAGNOSIS — U071 COVID-19: Secondary | ICD-10-CM | POA: Diagnosis not present

## 2020-05-05 DIAGNOSIS — Z20822 Contact with and (suspected) exposure to covid-19: Secondary | ICD-10-CM | POA: Diagnosis not present

## 2020-05-05 NOTE — Telephone Encounter (Signed)
I spoke with pt who gave me permission to speak with her husband; pt tested + covid on 05/04/20. Today pt is so tired and fatigued cannot carry on a conversation without being extremely tired. Mr Vega said sometimes he finds pt just staring into space; pt will respond to her husband but is just not feeling well at all. Pt has S/T that it really hurts when she swallows, dry cough, a lot of chest congestion; no SOB and pt has H/A and neck pain. Pt can move her neck but the neck still hurts. Pt's husband said other access nurse advised to drink plenty of fluids, rest, tylenol for fever, and self quarantining for 10 days from 05/04/20.  pts  Husband is concerned about how pt is feeling. Mr Ambers said later today he would take pt to UC in [place]. UC & ED precautions given and Mr Starnes voiced understanding. FYI to [employer].

## 2020-05-05 NOTE — Telephone Encounter (Signed)
Noted. Agree with recommendation for evaluation at [facility].

## 2020-05-05 NOTE — Telephone Encounter (Signed)
[facility] - Client TELEPHONE ADVICE RECORD Access[facility]urse Patient Name: Karen Ellison Gender: Female DOB: [birth year] Age: 24 Y 7 M 25 D Return Phone Number: [phone] (Primary) Address: City/State/Zip: [place] [address] Client [facility] - Client Client Site [place] - Day Physician Tor Netters- NP Contact Type Call Who Is Calling Patient / Member / Family / Caregiver Call Type Triage / Clinical Caller Name cody Relationship To Patient Spouse Return Phone Number [phone] (Primary) Chief Complaint BREATHING - shortness of breath or sounds breathless Reason for Call Symptomatic / Request for Health Information Initial Comment danisha, debbie gesterners office, Ortha thompsons husband is on line, positive for covid, sounding worse, can barley get a couple words out of her from very severe fatigue "staring into space" breathing seems okay, in possible to carry on a conversation without her getting tired Translation No Nurse Assessment Nurse: Clovis Riley, RN, Georgina Peer Date/Time (Eastern Time): 05/05/2020 9:40:09 AM Confirm and document reason for call. If symptomatic, describe symptoms. ---Caller states his wife is positive for covid, sounding worse, can barley get a couple words out of her from very severe fatigue "staring into space" breathing seems okay, impossible to carry on a conversation without her getting tired. Fever last night was 100.3. States she is awake and alert. Does the patient have any new or worsening symptoms? ---Yes Will a triage be completed? ---Yes Related visit to physician within the last 2 weeks? ---No Does the PT have any chronic conditions? (i.e. diabetes, asthma, this includes High risk factors for pregnancy, etc.) ---No Is the patient pregnant or possibly pregnant? (Ask all females between the ages of 23-55) ---No Is this a behavioral health or substance abuse  call? ---No Guidelines Guideline Title Affirmed Question Affirmed Notes Nurse Date/Time (Eastern Time) COVID-19 - Diagnosed or Suspected [1] COVID-19 diagnosed by positive lab test (e.g., PCR, rapid selftest kit) AND [2] mild symptoms (e.g., cough, [place], RN, Georgina Peer 05/05/2020 9:42:35 AM PLEASE NOTE: All timestamps contained within this report are represented as [place] Standard Time. CONFIDENTIALTY NOTICE: This fax transmission is intended only for the addressee. It contains information that is legally privileged, confidential or otherwise protected from use or disclosure. If you are not the intended recipient, you are strictly prohibited from reviewing, disclosing, copying using or disseminating any of this information or taking any action in reliance on or regarding this information. If you have received this fax in error, please notify us immediately by telephone so that we can arrange for its return to [place]. Phone: [phone], Toll-Free: [phone], Fax: [phone] Page: 2 of 2 Call Id: [id number] Guidelines Guideline Title Affirmed Question Affirmed Notes Nurse Date/Time Eilene Ghazi Time) fever, others) AND [9] no complications or SOB Disp. Time Eilene Ghazi Time) Disposition Final User 05/05/2020 9:38:24 AM Send to Urgent Queue Ilona Sorrel 05/05/2020 9:45:58 AM Home Care Yes Clovis Riley, RN, Leilani Merl Disagree/Comply Comply Caller Understands Yes PreDisposition Did not know what to do Care Advice Given Per Guideline HOME CARE: * You should be able to treat this at home. REASSURANCE AND EDUCATION - POSITIVE COVID-19 LAB TEST AND MILD SYMPTOMS: * You had a recent lab test for COVID-19 and it came back positive. * A positive result on a PCR or rapid self-test kit is highly accurate for diagnosing COVID-19. It is highly likely that you have COVID-19. * From what you have told me, your symptoms are mild. That is reassuring. * Here's some care advice to help you and to  help prevent  others from getting sick. GENERAL CARE ADVICE FOR COVID-19 SYMPTOMS: * The treatment is the same whether you have COVID-19, influenza or some other respiratory virus. * Cough: Use cough drops. * Feeling dehydrated: Drink extra liquids. If the air in your home is dry, use a humidifier. * Fever: For fever over 101 F (38.3 C), take acetaminophen every 4 to 6 hours (Adults 650 mg) OR ibuprofen every 6 to 8 hours (Adults 400 mg). Before taking any medicine, read all the instructions on the package. Do not take aspirin unless your doctor has prescribed it for you. * Muscle aches, headache, and other pains: Often this comes and goes with the fever. Take acetaminophen every 4 to 6 hours (Adults 650 mg) OR ibuprofen every 6 to 8 hours (Adults 400 mg). Before taking any medicine, read all the instructions on the package. HUMIDIFIER: * If the air is dry, use a humidifier in the bedroom. * Dry air makes coughs worse. HOW TO PROTECT OTHERS - WHEN YOU ARE SICK WITH COVID-19: * STAY HOME A MINIMUM OF 10 DAYS: Home isolation is needed for at least 10 days after the symptoms started. Stay home from school or work if you are sick. Do NOT go to religious services, child care centers, shopping, or other public places. Do NOT use public transportation (e.g., bus, taxis, ride-sharing). Do NOT allow any visitors to your home. Leave the house only if you need to seek urgent medical care. * COVER THE COUGH: Cough and sneeze into your shirt sleeve or inner elbow. Don't cough into your hand or the air. If available, cough into a tissue and throw it into a trash can. * [place] HANDS OFTEN: Wash hands often with soap and water. After coughing or sneezing are important times. If soap and water are not available, use an alcohol-based hand sanitizer with at least 60% alcohol, covering all surfaces of your hands and rubbing them together until they feel dry. Avoid touching your eyes, nose, and mouth with unwashed hands. * WEAR A MASK:  Wear a facemask when around others. Always wear a facemask (if available) if you have to leave your home (such as going to a medical facility). * CALL AHEAD IF MEDICAL CARE NEEDED: If you have a medical appointment, call your doctor's office and tell them you have or may have COVID-19. This will help the office protect themselves and other patients. They will give you directions. CALL BACK IF: * Fever over 103 F (39.4 C) * Fever lasts over 3 days * Chest pain or difficulty breathing occurs * Fever returns after being gone for 24 hours * You become worse CARE ADVICE given per COVID-19 - DIAGNOSED OR SUSPECTED (Adult) guideline.

## 2020-05-09 ENCOUNTER — Telehealth: Payer: Self-pay

## 2020-05-09 NOTE — Telephone Encounter (Signed)
[facility] Night - Client TELEPHONE ADVICE RECORD AccessNurse Patient Name: Karen Ellison Gender: Female DOB: April 20, 1996 Age: 24 Y 8 M 26 D Return Phone Number: [phone] (Primary), [phone] (Secondary) [place] [address] Client [facility] Night - Client Client Site [facility] - Night Physician Deboraha Sprang- NP Contact Type Call Who Is Calling Patient / Member / Family / Caregiver Call Type Triage / Clinical Caller Name Dereonna Lensing Relationship To Patient Spouse Return Phone Number [phone] (Primary) Chief Complaint Nosebleed Reason for Call Symptomatic / Request for Health Information Initial Comment Caller states his wife has covid and prescribed medication through urgent care and they don't know which medications to take and what they can't take. She has body aches, sore throat, cough, runny nose, nose bleed Translation No No Triage Reason Other Nurse Assessment Nurse: Rennis Petty, RN, Clydie Braun Date/Time Lamount Cohen Time): 05/06/2020 5:45:35 PM Confirm and document reason for call. If symptomatic, describe symptoms. ---Caller states his wife has covid and prescribed medication through urgent care and they don't know which medications to take and what they can't take. She has body aches, sore throat, cough, runny nose, nose bleed. Does the patient have any new or worsening symptoms? ---Yes Will a triage be completed? ---Yes Related visit to physician within the last 2 weeks? ---Yes Does the PT have any chronic conditions? (i.e. diabetes, asthma, this includes High risk factors for pregnancy, etc.) ---Unknown Is the patient pregnant or possibly pregnant? (Ask all females between the ages of 48-55) ---No Is this a behavioral health or substance abuse call? ---No Guidelines Guideline Title Affirmed Question Affirmed Notes Nurse Date/Time (Eastern Time) Recent Medical Visit  for Illness Follow-up Call [1] Recent medical visit within 24 hours AND [2] condition/symptoms SAME (unchanged) AND [3] caller has additional Gaynelle Adu 05/06/2020 5:56:25 PM PLEASE NOTE: All timestamps contained within this report are represented as [place] Standard Time. CONFIDENTIALTY NOTICE: This fax transmission is intended only for the addressee. It contains information that is legally privileged, confidential or otherwise protected from use or disclosure. If you are not the intended recipient, you are strictly prohibited from reviewing, disclosing, copying using or disseminating any of this information or taking any action in reliance on or regarding this information. If you have received this fax in error, please notify us immediately by telephone so that we can arrange for its return to [place]. Phone: [phone], Toll-Free: [phone], Fax: [phone] Page: 2 of 2 Call Id: [id number] Guidelines Guideline Title Affirmed Question Affirmed Notes Nurse Date/Time Lamount Cohen Time) questions triager can answer Disp. Time Lamount Cohen Time) Disposition Final User 05/06/2020 5:58:05 PM Home Care Yes Rennis Petty, RN, Pablo Ledger Disagree/Comply Comply Caller Understands Yes PreDisposition Did not know what to do Care Advice Given Per Guideline HOME CARE: * You should be able to treat this at home. * Answer the caller's question from information in triage guidelines, your clinical experience, or a relevant reference. * You've told me that you were recently seen and diagnosed with ________, AND that you feel the same (unchanged, not worse) today. CONTINUE TREATMENT: * Continue the other treatment you have been instructed to do by your HCP. CALL BACK IF: * You become worse CARE ADVICE given per Recent Medical Visit for Illness: Follow-Up Call (Adult) guideline.  Called and spoke with patients husband who states that his wife is doing much better and all of their questions were answered by  the access nurse. Instructed patient to call back  if there were any further questions. Patients husband verbalized understanding.

## 2020-05-09 NOTE — Telephone Encounter (Signed)
Noted appreciate follow-up

## 2020-09-21 DIAGNOSIS — H43399 Other vitreous opacities, unspecified eye: Secondary | ICD-10-CM | POA: Diagnosis not present

## 2020-09-21 DIAGNOSIS — H43819 Vitreous degeneration, unspecified eye: Secondary | ICD-10-CM | POA: Diagnosis not present

## 2020-10-04 ENCOUNTER — Other Ambulatory Visit: Payer: BC Managed Care – PPO

## 2020-10-11 ENCOUNTER — Encounter: Payer: BC Managed Care – PPO | Admitting: Family Medicine

## 2020-10-21 ENCOUNTER — Other Ambulatory Visit: Payer: Self-pay | Admitting: *Deleted

## 2020-10-21 DIAGNOSIS — Z3041 Encounter for surveillance of contraceptive pills: Secondary | ICD-10-CM

## 2020-10-21 NOTE — Telephone Encounter (Signed)
Last office visit 11/18/2019 for Med Refill for Birth Control. Last refilled 11/18/2019 for #84 with 3 refills.  No TOC appointments.

## 2020-10-23 MED ORDER — NORGESTIM-ETH ESTRAD TRIPHASIC 0.18/0.215/0.25 MG-35 MCG PO TABS: 1.0000 | ORAL_TABLET | Freq: Every day | ORAL | 0 refills | Status: DC

## 2020-11-29 DIAGNOSIS — N911 Secondary amenorrhea: Secondary | ICD-10-CM | POA: Diagnosis not present

## 2020-12-15 DIAGNOSIS — Z3481 Encounter for supervision of other normal pregnancy, first trimester: Secondary | ICD-10-CM | POA: Diagnosis not present

## 2020-12-15 DIAGNOSIS — Z3685 Encounter for antenatal screening for Streptococcus B: Secondary | ICD-10-CM | POA: Diagnosis not present

## 2020-12-15 DIAGNOSIS — Z31430 Encounter of female for testing for genetic disease carrier status for procreative management: Secondary | ICD-10-CM | POA: Diagnosis not present

## 2020-12-15 LAB — OB RESULTS CONSOLE GC/CHLAMYDIA
Chlamydia: NEGATIVE
Gonorrhea: NEGATIVE

## 2020-12-15 LAB — OB RESULTS CONSOLE ABO/RH: RH Type: NEGATIVE

## 2020-12-15 LAB — HEPATITIS C ANTIBODY: HCV Ab: NEGATIVE

## 2020-12-15 LAB — OB RESULTS CONSOLE ANTIBODY SCREEN: Antibody Screen: POSITIVE

## 2020-12-15 LAB — OB RESULTS CONSOLE HIV ANTIBODY (ROUTINE TESTING): HIV: NONREACTIVE

## 2020-12-15 LAB — OB RESULTS CONSOLE RUBELLA ANTIBODY, IGM: Rubella: NON-IMMUNE/NOT IMMUNE

## 2020-12-15 LAB — OB RESULTS CONSOLE RPR: RPR: NONREACTIVE

## 2020-12-15 LAB — OB RESULTS CONSOLE HEPATITIS B SURFACE ANTIGEN: Hepatitis B Surface Ag: NEGATIVE

## 2020-12-22 DIAGNOSIS — Z124 Encounter for screening for malignant neoplasm of cervix: Secondary | ICD-10-CM | POA: Diagnosis not present

## 2020-12-22 DIAGNOSIS — Z113 Encounter for screening for infections with a predominantly sexual mode of transmission: Secondary | ICD-10-CM | POA: Diagnosis not present

## 2020-12-22 DIAGNOSIS — Z3A10 10 weeks gestation of pregnancy: Secondary | ICD-10-CM | POA: Diagnosis not present

## 2020-12-22 DIAGNOSIS — Z3481 Encounter for supervision of other normal pregnancy, first trimester: Secondary | ICD-10-CM | POA: Diagnosis not present

## 2021-01-09 DIAGNOSIS — Z3682 Encounter for antenatal screening for nuchal translucency: Secondary | ICD-10-CM | POA: Diagnosis not present

## 2021-01-09 DIAGNOSIS — Z3A13 13 weeks gestation of pregnancy: Secondary | ICD-10-CM | POA: Diagnosis not present

## 2021-01-23 DIAGNOSIS — O360191 Maternal care for anti-D [Rh] antibodies, unspecified trimester, fetus 1: Secondary | ICD-10-CM | POA: Diagnosis not present

## 2021-02-21 DIAGNOSIS — Z3A19 19 weeks gestation of pregnancy: Secondary | ICD-10-CM | POA: Diagnosis not present

## 2021-02-21 DIAGNOSIS — Z3480 Encounter for supervision of other normal pregnancy, unspecified trimester: Secondary | ICD-10-CM | POA: Diagnosis not present

## 2021-02-21 DIAGNOSIS — Z363 Encounter for antenatal screening for malformations: Secondary | ICD-10-CM | POA: Diagnosis not present

## 2021-03-23 DIAGNOSIS — Z3A23 23 weeks gestation of pregnancy: Secondary | ICD-10-CM | POA: Diagnosis not present

## 2021-03-23 DIAGNOSIS — Z362 Encounter for other antenatal screening follow-up: Secondary | ICD-10-CM | POA: Diagnosis not present

## 2021-03-23 DIAGNOSIS — Z23 Encounter for immunization: Secondary | ICD-10-CM | POA: Diagnosis not present

## 2021-04-18 DIAGNOSIS — Z23 Encounter for immunization: Secondary | ICD-10-CM | POA: Diagnosis not present

## 2021-04-18 DIAGNOSIS — O360120 Maternal care for anti-D [Rh] antibodies, second trimester, not applicable or unspecified: Secondary | ICD-10-CM | POA: Diagnosis not present

## 2021-04-18 DIAGNOSIS — Z3A27 27 weeks gestation of pregnancy: Secondary | ICD-10-CM | POA: Diagnosis not present

## 2021-04-18 DIAGNOSIS — Z3480 Encounter for supervision of other normal pregnancy, unspecified trimester: Secondary | ICD-10-CM | POA: Diagnosis not present

## 2021-04-25 ENCOUNTER — Other Ambulatory Visit: Payer: Self-pay | Admitting: Obstetrics & Gynecology

## 2021-04-25 ENCOUNTER — Other Ambulatory Visit: Payer: BC Managed Care – PPO

## 2021-04-25 ENCOUNTER — Ambulatory Visit: Payer: BC Managed Care – PPO

## 2021-04-25 DIAGNOSIS — Z363 Encounter for antenatal screening for malformations: Secondary | ICD-10-CM

## 2021-04-26 ENCOUNTER — Encounter: Payer: Self-pay | Admitting: *Deleted

## 2021-05-02 ENCOUNTER — Other Ambulatory Visit: Payer: Self-pay

## 2021-05-02 ENCOUNTER — Other Ambulatory Visit: Payer: BC Managed Care – PPO

## 2021-05-02 ENCOUNTER — Ambulatory Visit: Payer: BC Managed Care – PPO | Attending: Obstetrics & Gynecology

## 2021-05-02 ENCOUNTER — Ambulatory Visit: Payer: BC Managed Care – PPO | Admitting: *Deleted

## 2021-05-02 ENCOUNTER — Ambulatory Visit: Payer: BC Managed Care – PPO

## 2021-05-02 ENCOUNTER — Encounter: Payer: Self-pay | Admitting: *Deleted

## 2021-05-02 ENCOUNTER — Other Ambulatory Visit: Payer: Self-pay | Admitting: Obstetrics & Gynecology

## 2021-05-02 ENCOUNTER — Ambulatory Visit (HOSPITAL_BASED_OUTPATIENT_CLINIC_OR_DEPARTMENT_OTHER): Payer: BC Managed Care – PPO | Admitting: Obstetrics and Gynecology

## 2021-05-02 VITALS — BP 128/84 | HR 109

## 2021-05-02 DIAGNOSIS — O99343 Other mental disorders complicating pregnancy, third trimester: Secondary | ICD-10-CM

## 2021-05-02 DIAGNOSIS — O99213 Obesity complicating pregnancy, third trimester: Secondary | ICD-10-CM | POA: Diagnosis not present

## 2021-05-02 DIAGNOSIS — F32A Depression, unspecified: Secondary | ICD-10-CM | POA: Diagnosis not present

## 2021-05-02 DIAGNOSIS — O361930 Maternal care for other isoimmunization, third trimester, not applicable or unspecified: Secondary | ICD-10-CM | POA: Insufficient documentation

## 2021-05-02 DIAGNOSIS — Z363 Encounter for antenatal screening for malformations: Secondary | ICD-10-CM | POA: Diagnosis not present

## 2021-05-02 DIAGNOSIS — Z3A29 29 weeks gestation of pregnancy: Secondary | ICD-10-CM

## 2021-05-02 DIAGNOSIS — R768 Other specified abnormal immunological findings in serum: Secondary | ICD-10-CM | POA: Diagnosis not present

## 2021-05-02 DIAGNOSIS — E669 Obesity, unspecified: Secondary | ICD-10-CM | POA: Diagnosis not present

## 2021-05-02 NOTE — Progress Notes (Signed)
Maternal-Fetal Medicine   Name: Karen Ellison DOB: 1996-01-27 MRN: [id number] Referring Provider: Mitchel Honour, DO  I had the pleasure of [facility] at [facility]. She is G1 P0 at 29w 1d gestation and is here for ultrasound and consultation. 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  Patient's blood type is A negative and she received RhoGAM in third trimester.  She does not have gestational diabetes.  On cell free fetal DNA screening, the risks of fetal aneuploidies are not increased.  Carrier screening including SMA and CF are negative.  Past medical history: No history of diabetes or hypertension or any chronic medical conditions. Past surgical history: Wisdom tooth surgery. Medications: Prenatal vitamins, sertraline 50 mg daily Allergies: Metronidazole (seizures). Social history: Denies tobacco or drug or alcohol use.  She has been married 3 years and her husband is in good health.  She is a Presenter, broadcasting. Family history: No history of venous thromboembolism in the family.  Ultrasound We performed fetal anatomical survey.  No markers of aneuploidies or obvious fetal structural defects are seen.  Fetal growth is appropriate for gestational age.  Amniotic fluid is normal and good fetal activity seen.  Fetal anatomical survey is limited because of advanced gestational age. Middle cerebral artery (MCA) Doppler, performed because of anti-M antibodies, showed normal peak systolic velocity measurements (no evidence of fetal anemia).  Placenta appears normal and there is no evidence of fetal hydrops.  Blood pressure today at her office is 128/84 mmHg.  Prepregnancy BMI 31.  Our concerns include Anti-N antibodies Anti-N (anti-M) antibodies are IgM antibodies at room temperature and are cold agglutinins. They are unlikely to cross  the placenta. In a series of 131 patients with these antibodies, no fetal hemolysis has been reported. I reassured the patient of normal MCA Doppler study on today's ultrasound (no evidence of fetal anemia). Because of low likelihood of hemolysis, I do not recommend MCA Doppler surveillance.  I discussed the benefit of anti-D immunoglobulin (RhoGAM) during pregnancy.  Patient's husband is Rh+  Sertraline No increase in congenital malformations is expected with the use of this medication. It is an SSRI agent. A small absolute increase in primary pulmonary hypertension has been observed. However, untreated depression can be associated with adverse maternal outcomes and the benefit of treatment outweighs the small risk. Patient was counseled on the small risk for primary pulmonary hypertension.   Sertraline is excreted into breast milk and long-term effects on the infant are not known. The American Academy of Pediatrics classifies this as a drug whose effect on the newborn is unknown but may be of concern.   Recommendations -Follow fetal growth assessment in 4 to 6 weeks that may be performed at your office.  Thank you for consultation.  If you have any questions or concerns, please contact me the [facility].  Consultation including face-to-face (more than 50%) counseling 30 minutes.

## 2021-05-14 NOTE — L&D Delivery Note (Signed)
Delivery Note  RN was unable to find FHT x 2 minutes.  Once found, FHR was in the 60s.  I recommended episiotomy to expedite delivery.  Patient agreed and median episiotomy was cut.  Patient delivered with the next push.    At 6:28 AM a viable female was delivered via Vaginal, Spontaneous (Presentation:  LOA).  Newborn was immediately brought to the abdomen and poor tone, poor respiratory effort were noted.  One small cry immediately after delivery.  Bulb suction and stimulation was performed but with no improvement, early cord clamp and cut was done and baby taken to warmer for assessment.  HR was in 120s per RN but poor O2 sats and CODE APGAR was called.  After blow by O2 given, newborn improved and was left skin to skin with mother.  APGAR: per NICU; weight pending.   Placenta status: Spontaneous, Intact; sent to pathology for newborn distress. 3V Cord with the following complications: none.  Cord pH: n/a  Anesthesia: Epidural Episiotomy:  median Lacerations:  none Suture Repair: 3.0 vicryl rapide Est. Blood Loss (mL):  150  Mom to postpartum.  Baby to Couplet care / Skin to Skin.  Mitchel Honour 06/28/2021, 7:00 AM

## 2021-06-12 DIAGNOSIS — O139 Gestational [pregnancy-induced] hypertension without significant proteinuria, unspecified trimester: Secondary | ICD-10-CM | POA: Diagnosis not present

## 2021-06-15 ENCOUNTER — Inpatient Hospital Stay (HOSPITAL_COMMUNITY)
Admission: AD | Admit: 2021-06-15 | Discharge: 2021-06-16 | Disposition: A | Discharge status: Home / Self Care | Payer: BC Managed Care – PPO | Attending: Obstetrics and Gynecology | Admitting: Obstetrics and Gynecology

## 2021-06-15 ENCOUNTER — Other Ambulatory Visit: Payer: Self-pay

## 2021-06-15 ENCOUNTER — Encounter (HOSPITAL_COMMUNITY): Payer: Self-pay | Admitting: Obstetrics and Gynecology

## 2021-06-15 DIAGNOSIS — Z3A35 35 weeks gestation of pregnancy: Secondary | ICD-10-CM | POA: Insufficient documentation

## 2021-06-15 DIAGNOSIS — R109 Unspecified abdominal pain: Secondary | ICD-10-CM | POA: Insufficient documentation

## 2021-06-15 DIAGNOSIS — O4693 Antepartum hemorrhage, unspecified, third trimester: Secondary | ICD-10-CM | POA: Insufficient documentation

## 2021-06-15 DIAGNOSIS — O4700 False labor before 37 completed weeks of gestation, unspecified trimester: Secondary | ICD-10-CM | POA: Diagnosis not present

## 2021-06-15 DIAGNOSIS — Z369 Encounter for antenatal screening, unspecified: Secondary | ICD-10-CM | POA: Diagnosis not present

## 2021-06-15 DIAGNOSIS — O133 Gestational [pregnancy-induced] hypertension without significant proteinuria, third trimester: Secondary | ICD-10-CM | POA: Insufficient documentation

## 2021-06-15 DIAGNOSIS — O4703 False labor before 37 completed weeks of gestation, third trimester: Secondary | ICD-10-CM | POA: Insufficient documentation

## 2021-06-15 DIAGNOSIS — R102 Pelvic and perineal pain: Secondary | ICD-10-CM | POA: Insufficient documentation

## 2021-06-15 DIAGNOSIS — O26893 Other specified pregnancy related conditions, third trimester: Secondary | ICD-10-CM | POA: Diagnosis not present

## 2021-06-15 LAB — URINALYSIS, MICROSCOPIC (REFLEX)

## 2021-06-15 LAB — URINALYSIS, ROUTINE W REFLEX MICROSCOPIC
Bilirubin Urine: NEGATIVE
Glucose, UA: NEGATIVE mg/dL
Ketones, ur: NEGATIVE mg/dL
Nitrite: NEGATIVE
Protein, ur: NEGATIVE mg/dL
Specific Gravity, Urine: 1.02 (ref 1.005–1.030)
pH: 6.5 (ref 5.0–8.0)

## 2021-06-15 LAB — WET PREP, GENITAL
Clue Cells Wet Prep HPF POC: NONE SEEN
Sperm: NONE SEEN
Trich, Wet Prep: NONE SEEN
WBC, Wet Prep HPF POC: >=10 — AB (ref <10)
Yeast Wet Prep HPF POC: NONE SEEN

## 2021-06-15 MED ORDER — NIFEDIPINE 10 MG PO CAPS
10.0000 mg | ORAL_CAPSULE | ORAL | Status: AC | PRN
  Administered 2021-06-15 (×4): 10 mg via ORAL
  Filled 2021-06-15 (×4): qty 1

## 2021-06-15 MED ORDER — LACTATED RINGERS IV SOLN: Freq: Once | INTRAVENOUS | Status: AC

## 2021-06-15 MED ORDER — ONDANSETRON 4 MG PO TBDP
4.0000 mg | ORAL_TABLET | Freq: Once | ORAL | Status: AC
  Administered 2021-06-15: 4 mg via ORAL
  Filled 2021-06-15: qty 1

## 2021-06-15 NOTE — MAU Provider Note (Signed)
Chief Complaint:  Abdominal Pain and Vaginal Bleeding   Event Date/Time   First Provider Initiated Contact with Patient 06/15/21 2018     HPI: Karen Ellison is a 26 y.o. G1P0000 at 44w3dwho presents to maternity admissions reporting uterine cramping and one episode of bleeding tonight.  No bleeding now.  . She reports good fetal movement, denies LOF, urinary symptoms, h/a, dizziness, n/v, diarrhea, constipation or fever/chills.   Seen in office today and cervix was checked and pt was told it was closed but soft.   Abdominal Pain This is a new problem. The current episode started today. The problem occurs intermittently. The problem has been unchanged. The quality of the pain is cramping. The abdominal pain does not radiate. Pertinent negatives include no constipation, diarrhea, dysuria or fever. Nothing aggravates the pain. The pain is relieved by Nothing. She has tried nothing for the symptoms.  Vaginal Bleeding The patient's primary symptoms include pelvic pain and vaginal bleeding. The patient's pertinent negatives include no genital itching, genital lesions or genital odor. This is a new problem. The current episode started today. The problem has been resolved. The pain is mild. Associated symptoms include abdominal pain. Pertinent negatives include no constipation, diarrhea, dysuria or fever. The vaginal discharge was bloody. She has been passing clots. She has not been passing tissue. Nothing aggravates the symptoms. She has tried nothing for the symptoms.    RN Note: PT SAYS  WENT TO OFFIC E AT 430PM- HAD VB  HOME AT 630- CLOT OF BLOOD IN TOILET - AND RED BLOOD X 30 MIN  IN TRIAGE NOTHING  FEELS PELVIC PAIN AND PRESSURE- STARTED AFTER OFFICE VISIT  - SAID CERVIX WAS SOFT  FEELS BABY MOVE  Past Medical History: Past Medical History:  Diagnosis Date   Bacterial vaginosis    Depression     Past obstetric history: OB History  Gravida Para Term Preterm AB Living  1 0 0 0 0 0   SAB IAB Ectopic Multiple Live Births  0 0 0 0 0    # Outcome Date GA Lbr Len/2nd Weight Sex Delivery Anes PTL Lv  1 Current             Past Surgical History: Past Surgical History:  Procedure Laterality Date   WISDOM TOOTH EXTRACTION      Family History: Family History  Problem Relation Age of Onset   106 / Stillbirths Mother    Hypertension Father    Drug abuse Sister    Cancer Paternal Aunt    Cancer Paternal Uncle    Cancer Maternal Grandmother     Social History: Social History   Tobacco Use   Smoking status: Never   Smokeless tobacco: Never  Vaping Use   Vaping Use: Never used  Substance Use Topics   Alcohol use: Yes    Comment: occ   Drug use: No    Allergies:  Allergies  Allergen Reactions   Metrogel [Metronidazole] Other (See Comments)    Confusion and vomiting    Meds:  Medications Prior to Admission  Medication Sig Dispense Refill Last Dose   Norgestimate-Ethinyl Estradiol Triphasic (TRI-ESTARYLLA) 0.18/0.215/0.25 MG-35 MCG tablet Take 1 tablet by mouth daily. (Patient not taking: Reported on 05/02/2021) 84 tablet 0    Prenatal Vit-Fe Fumarate-FA (PRENATAL MULTIVITAMIN) TABS tablet Take 1 tablet by mouth daily at 12 noon.      sertraline (ZOLOFT) 25 MG tablet Take 25 mg by mouth daily.       I have  reviewed patient's Past Medical Hx, Surgical Hx, Family Hx, Social Hx, medications and allergies.   ROS:  Review of Systems  Constitutional:  Negative for fever.  Gastrointestinal:  Positive for abdominal pain. Negative for constipation and diarrhea.  Genitourinary:  Positive for pelvic pain and vaginal bleeding. Negative for dysuria.  Other systems negative  Physical Exam  Patient Vitals for the past 24 hrs:  BP Temp Temp src Pulse Resp Height Weight  06/15/21 2012 (!) 145/83 97.7 F (36.5 C) Oral 86 20 5\' 4"  (1.626 m) 104.5 kg   Constitutional: Well-developed, well-nourished female in no acute distress.  Cardiovascular: normal  rate and rhythm Respiratory: normal effort GI: Abd soft, non-tender, gravid appropriate for gestational age.   No rebound or guarding. MS: Extremities nontender, no edema, normal ROM Neurologic: Alert and oriented x 4.  GU: Neg CVAT.  PELVIC EXAM:   Dilation: Closed Effacement (%): 50 Exam by:: Hansel Feinstein, CNM No bleeding noted  FHT:  Baseline 140 , moderate variability, accelerations present, no decelerations Contractions: q 4-6 mins Irregular  Rare   Labs: Results for orders placed or performed during the hospital encounter of 06/15/21 (from the past 24 hour(s))  Urinalysis, Routine w reflex microscopic Urine, Clean Catch     Status: Abnormal   Collection Time: 06/15/21  8:23 PM  Result Value Ref Range   Color, Urine YELLOW YELLOW   APPearance CLEAR CLEAR   Specific Gravity, Urine 1.020 1.005 - 1.030   pH 6.5 5.0 - 8.0   Glucose, UA NEGATIVE NEGATIVE mg/dL   Hgb urine dipstick LARGE (A) NEGATIVE   Bilirubin Urine NEGATIVE NEGATIVE   Ketones, ur NEGATIVE NEGATIVE mg/dL   Protein, ur NEGATIVE NEGATIVE mg/dL   Nitrite NEGATIVE NEGATIVE   Leukocytes,Ua MODERATE (A) NEGATIVE  Urinalysis, Microscopic (reflex)     Status: Abnormal   Collection Time: 06/15/21  8:23 PM  Result Value Ref Range   RBC / HPF 0-5 0 - 5 RBC/hpf   WBC, UA 6-10 0 - 5 WBC/hpf   Bacteria, UA FEW (A) NONE SEEN   Squamous Epithelial / LPF 6-10 0 - 5   Mucus PRESENT   Wet prep, genital     Status: Abnormal   Collection Time: 06/15/21  9:10 PM   Specimen: Vaginal  Result Value Ref Range   Yeast Wet Prep HPF POC NONE SEEN NONE SEEN   Trich, Wet Prep NONE SEEN NONE SEEN   Clue Cells Wet Prep HPF POC NONE SEEN NONE SEEN   WBC, Wet Prep HPF POC >=10 (A) <10   Sperm NONE SEEN        Imaging:  No results found.  MAU Course/MDM: I have ordered labs and reviewed results. UA is normal as is wet prep NST reviewed, reactive Consult Dr Royston Sinner with presentation, exam findings and asked if she wants me to  run Preeclampsia labs since pt had them done in office today. She states "no" since they will be resulted soon in office Treatments in MAU included Procardia series and IVF bolus Contractions lessened with Procardia but did not stop We ran a fluid bolus (declined Terb) and they improved greatly Cervix rechecked and there was no change.    Assessment: Single IUP at [redacted]w[redacted]d Preterm uterine contractions Gestational Hypertension  Plan: Discharge home Reviewed Preterm Labor precautions and fetal kick counts Follow up in Office for prenatal visits and recheck Encouraged to return if she develops worsening of symptoms, increase in pain, fever, or other concerning symptoms.  Pt  stable at time of discharge.  Hansel Feinstein CNM, MSN Certified Nurse-Midwife 06/15/2021 8:19 PM

## 2021-06-15 NOTE — MAU Note (Signed)
PT SAYS  WENT TO OFFIC E AT 430PM- HAD VB  HOME AT 630- CLOT OF BLOOD IN TOILET - AND RED BLOOD X 30 MIN  IN TRIAGE NOTHING  FEELS PELVIC PAIN AND PRESSURE- STARTED AFTER OFFICE VISIT  - SAID CERVIX WAS SOFT  FEELS BABY MOVE

## 2021-06-16 ENCOUNTER — Encounter (HOSPITAL_COMMUNITY): Payer: Self-pay | Admitting: *Deleted

## 2021-06-16 ENCOUNTER — Telehealth (HOSPITAL_COMMUNITY): Payer: Self-pay | Admitting: *Deleted

## 2021-06-16 DIAGNOSIS — O4700 False labor before 37 completed weeks of gestation, unspecified trimester: Secondary | ICD-10-CM

## 2021-06-16 DIAGNOSIS — Z3A35 35 weeks gestation of pregnancy: Secondary | ICD-10-CM | POA: Diagnosis not present

## 2021-06-16 LAB — GC/CHLAMYDIA PROBE AMP (~~LOC~~) NOT AT ARMC
Chlamydia: NEGATIVE
Comment: NEGATIVE
Comment: NORMAL
Neisseria Gonorrhea: NEGATIVE

## 2021-06-16 NOTE — Progress Notes (Signed)
Discharge instructions reviewed with pt and husband; both verbalized understanding.

## 2021-06-16 NOTE — Telephone Encounter (Signed)
Preadmission screen  

## 2021-06-19 ENCOUNTER — Ambulatory Visit (INDEPENDENT_AMBULATORY_CARE_PROVIDER_SITE_OTHER): Payer: Self-pay | Admitting: Pediatrics

## 2021-06-19 ENCOUNTER — Other Ambulatory Visit: Payer: Self-pay

## 2021-06-19 DIAGNOSIS — O99891 Other specified diseases and conditions complicating pregnancy: Secondary | ICD-10-CM | POA: Diagnosis not present

## 2021-06-19 DIAGNOSIS — Z3685 Encounter for antenatal screening for Streptococcus B: Secondary | ICD-10-CM | POA: Diagnosis not present

## 2021-06-19 DIAGNOSIS — Z3A36 36 weeks gestation of pregnancy: Secondary | ICD-10-CM | POA: Diagnosis not present

## 2021-06-19 DIAGNOSIS — Z7681 Expectant parent(s) prebirth pediatrician visit: Secondary | ICD-10-CM

## 2021-06-19 LAB — OB RESULTS CONSOLE GBS: GBS: POSITIVE

## 2021-06-19 NOTE — Progress Notes (Signed)
Prenatal counseling for impending newborn done. Parents agree with vaccine policy. Mom is being induced at 37 weeks due to borderline GHTN and ankle swelling. Negative for pre-eclampsia labs.  Z76.81

## 2021-06-19 NOTE — Patient Instructions (Signed)
Congratulations and see you next week!

## 2021-06-22 ENCOUNTER — Encounter (HOSPITAL_COMMUNITY): Payer: Self-pay | Admitting: Obstetrics & Gynecology

## 2021-06-26 ENCOUNTER — Other Ambulatory Visit: Payer: Self-pay

## 2021-06-27 ENCOUNTER — Inpatient Hospital Stay (HOSPITAL_COMMUNITY)
Admission: AD | Admit: 2021-06-27 | Discharge: 2021-06-30 | DRG: 807 | Disposition: A | Discharge status: Home / Self Care | Payer: BC Managed Care – PPO | Attending: Obstetrics & Gynecology | Admitting: Obstetrics & Gynecology

## 2021-06-27 ENCOUNTER — Inpatient Hospital Stay (HOSPITAL_COMMUNITY): Payer: BC Managed Care – PPO | Admitting: Anesthesiology

## 2021-06-27 ENCOUNTER — Encounter (HOSPITAL_COMMUNITY): Payer: Self-pay | Admitting: Obstetrics & Gynecology

## 2021-06-27 ENCOUNTER — Inpatient Hospital Stay (HOSPITAL_COMMUNITY): Payer: BC Managed Care – PPO

## 2021-06-27 DIAGNOSIS — Z3A37 37 weeks gestation of pregnancy: Secondary | ICD-10-CM

## 2021-06-27 DIAGNOSIS — Z20822 Contact with and (suspected) exposure to covid-19: Secondary | ICD-10-CM | POA: Diagnosis present

## 2021-06-27 DIAGNOSIS — Z23 Encounter for immunization: Secondary | ICD-10-CM

## 2021-06-27 DIAGNOSIS — F32A Depression, unspecified: Secondary | ICD-10-CM | POA: Diagnosis present

## 2021-06-27 DIAGNOSIS — O99344 Other mental disorders complicating childbirth: Secondary | ICD-10-CM | POA: Diagnosis not present

## 2021-06-27 DIAGNOSIS — O99824 Streptococcus B carrier state complicating childbirth: Secondary | ICD-10-CM | POA: Diagnosis present

## 2021-06-27 DIAGNOSIS — O139 Gestational [pregnancy-induced] hypertension without significant proteinuria, unspecified trimester: Secondary | ICD-10-CM | POA: Diagnosis present

## 2021-06-27 DIAGNOSIS — O164 Unspecified maternal hypertension, complicating childbirth: Secondary | ICD-10-CM | POA: Diagnosis not present

## 2021-06-27 DIAGNOSIS — Z3490 Encounter for supervision of normal pregnancy, unspecified, unspecified trimester: Secondary | ICD-10-CM

## 2021-06-27 DIAGNOSIS — O134 Gestational [pregnancy-induced] hypertension without significant proteinuria, complicating childbirth: Principal | ICD-10-CM | POA: Diagnosis present

## 2021-06-27 HISTORY — DX: Essential (primary) hypertension: I10

## 2021-06-27 LAB — RPR: RPR Ser Ql: NONREACTIVE

## 2021-06-27 LAB — CBC
HCT: 35.1 % — ABNORMAL LOW (ref 36.0–46.0)
HCT: 35.2 % — ABNORMAL LOW (ref 36.0–46.0)
Hemoglobin: 11.3 g/dL — ABNORMAL LOW (ref 12.0–15.0)
Hemoglobin: 11.6 g/dL — ABNORMAL LOW (ref 12.0–15.0)
MCH: 26.9 pg (ref 26.0–34.0)
MCH: 27.6 pg (ref 26.0–34.0)
MCHC: 32.2 g/dL (ref 30.0–36.0)
MCHC: 33 g/dL (ref 30.0–36.0)
MCV: 83.6 fL (ref 80.0–100.0)
MCV: 83.6 fL (ref 80.0–100.0)
Platelets: 234 10*3/uL (ref 150–400)
Platelets: 262 10*3/uL (ref 150–400)
RBC: 4.2 MIL/uL (ref 3.87–5.11)
RBC: 4.21 MIL/uL (ref 3.87–5.11)
RDW: 13.9 % (ref 11.5–15.5)
RDW: 14 % (ref 11.5–15.5)
WBC: 16.9 10*3/uL — ABNORMAL HIGH (ref 4.0–10.5)
WBC: 17.6 10*3/uL — ABNORMAL HIGH (ref 4.0–10.5)
nRBC: 0 % (ref 0.0–0.2)
nRBC: 0 % (ref 0.0–0.2)

## 2021-06-27 LAB — COMPREHENSIVE METABOLIC PANEL
ALT: 14 U/L (ref 0–44)
AST: 19 U/L (ref 15–41)
Albumin: 2.4 g/dL — ABNORMAL LOW (ref 3.5–5.0)
Alkaline Phosphatase: 153 U/L — ABNORMAL HIGH (ref 38–126)
Anion gap: 8 (ref 5–15)
BUN: 9 mg/dL (ref 6–20)
CO2: 20 mmol/L — ABNORMAL LOW (ref 22–32)
Calcium: 9.3 mg/dL (ref 8.9–10.3)
Chloride: 106 mmol/L (ref 98–111)
Creatinine, Ser: 0.57 mg/dL (ref 0.44–1.00)
GFR, Estimated: >60 mL/min (ref >60)
Glucose, Bld: 96 mg/dL (ref 70–99)
Potassium: 4.1 mmol/L (ref 3.5–5.1)
Sodium: 134 mmol/L — ABNORMAL LOW (ref 135–145)
Total Bilirubin: 0.2 mg/dL — ABNORMAL LOW (ref 0.3–1.2)
Total Protein: 6.2 g/dL — ABNORMAL LOW (ref 6.5–8.1)

## 2021-06-27 LAB — TYPE AND SCREEN
ABO/RH(D): A NEG
Antibody Screen: NEGATIVE

## 2021-06-27 LAB — RESP PANEL BY RT-PCR (FLU A&B, COVID) ARPGX2
Influenza A by PCR: NEGATIVE
Influenza B by PCR: NEGATIVE
SARS Coronavirus 2 by RT PCR: NEGATIVE

## 2021-06-27 MED ORDER — EPHEDRINE 5 MG/ML INJ: 10.0000 mg | INTRAVENOUS | Status: DC | PRN

## 2021-06-27 MED ORDER — LIDOCAINE HCL (PF) 1 % IJ SOLN: 30.0000 mL | INTRAMUSCULAR | Status: DC | PRN

## 2021-06-27 MED ORDER — LACTATED RINGERS IV SOLN: INTRAVENOUS | Status: DC

## 2021-06-27 MED ORDER — OXYCODONE-ACETAMINOPHEN 5-325 MG PO TABS: 1.0000 | ORAL_TABLET | ORAL | Status: DC | PRN

## 2021-06-27 MED ORDER — OXYTOCIN-SODIUM CHLORIDE 30-0.9 UT/500ML-% IV SOLN
1.0000 m[IU]/min | INTRAVENOUS | Status: DC
  Administered 2021-06-27: 2 m[IU]/min via INTRAVENOUS
  Filled 2021-06-27: qty 500

## 2021-06-27 MED ORDER — PHENYLEPHRINE 40 MCG/ML (10ML) SYRINGE FOR IV PUSH (FOR BLOOD PRESSURE SUPPORT): 80.0000 ug | PREFILLED_SYRINGE | INTRAVENOUS | Status: DC | PRN

## 2021-06-27 MED ORDER — SERTRALINE HCL 50 MG PO TABS
50.0000 mg | ORAL_TABLET | Freq: Every day | ORAL | Status: DC
  Administered 2021-06-27: 50 mg via ORAL
  Filled 2021-06-27 (×2): qty 1

## 2021-06-27 MED ORDER — TERBUTALINE SULFATE 1 MG/ML IJ SOLN: 0.2500 mg | Freq: Once | INTRAMUSCULAR | Status: DC | PRN

## 2021-06-27 MED ORDER — LACTATED RINGERS IV SOLN
500.0000 mL | Freq: Once | INTRAVENOUS | Status: AC
  Administered 2021-06-27: 500 mL via INTRAVENOUS

## 2021-06-27 MED ORDER — OXYCODONE-ACETAMINOPHEN 5-325 MG PO TABS
2.0000 | ORAL_TABLET | ORAL | Status: DC | PRN
  Administered 2021-06-28: 2 via ORAL
  Filled 2021-06-27: qty 2

## 2021-06-27 MED ORDER — MISOPROSTOL 50MCG HALF TABLET
50.0000 ug | ORAL_TABLET | ORAL | Status: DC | PRN
  Administered 2021-06-27: 50 ug via BUCCAL
  Filled 2021-06-27: qty 1

## 2021-06-27 MED ORDER — FENTANYL CITRATE (PF) 100 MCG/2ML IJ SOLN
50.0000 ug | INTRAMUSCULAR | Status: DC | PRN
  Administered 2021-06-27: 50 ug via INTRAVENOUS
  Administered 2021-06-27: 100 ug via INTRAVENOUS
  Filled 2021-06-27 (×2): qty 2

## 2021-06-27 MED ORDER — DIPHENHYDRAMINE HCL 50 MG/ML IJ SOLN: 12.5000 mg | INTRAMUSCULAR | Status: DC | PRN

## 2021-06-27 MED ORDER — OXYTOCIN BOLUS FROM INFUSION
333.0000 mL | Freq: Once | INTRAVENOUS | Status: AC
  Administered 2021-06-28: 333 mL via INTRAVENOUS

## 2021-06-27 MED ORDER — FENTANYL-BUPIVACAINE-NACL 0.5-0.125-0.9 MG/250ML-% EP SOLN
12.0000 mL/h | EPIDURAL | Status: DC | PRN
  Administered 2021-06-27: 12 mL/h via EPIDURAL
  Filled 2021-06-27: qty 250

## 2021-06-27 MED ORDER — ACETAMINOPHEN 325 MG PO TABS
650.0000 mg | ORAL_TABLET | ORAL | Status: DC | PRN
  Administered 2021-06-27: 650 mg via ORAL
  Filled 2021-06-27: qty 2

## 2021-06-27 MED ORDER — LACTATED RINGERS AMNIOINFUSION: INTRAVENOUS | Status: DC

## 2021-06-27 MED ORDER — ONDANSETRON HCL 4 MG/2ML IJ SOLN
4.0000 mg | Freq: Four times a day (QID) | INTRAMUSCULAR | Status: DC | PRN
  Administered 2021-06-27: 4 mg via INTRAVENOUS
  Filled 2021-06-27: qty 2

## 2021-06-27 MED ORDER — SODIUM CHLORIDE 0.9 % IV SOLN
5.0000 10*6.[IU] | Freq: Once | INTRAVENOUS | Status: AC
  Administered 2021-06-27: 5 10*6.[IU] via INTRAVENOUS
  Filled 2021-06-27: qty 5

## 2021-06-27 MED ORDER — MISOPROSTOL 25 MCG QUARTER TABLET
25.0000 ug | ORAL_TABLET | ORAL | Status: DC | PRN
  Administered 2021-06-27 (×2): 25 ug via VAGINAL
  Filled 2021-06-27 (×3): qty 1

## 2021-06-27 MED ORDER — LACTATED RINGERS IV SOLN
500.0000 mL | INTRAVENOUS | Status: DC | PRN
  Administered 2021-06-27: 300 mL via INTRAVENOUS

## 2021-06-27 MED ORDER — LIDOCAINE HCL (PF) 1 % IJ SOLN
INTRAMUSCULAR | Status: DC | PRN
  Administered 2021-06-27: 11 mL via EPIDURAL

## 2021-06-27 MED ORDER — SOD CITRATE-CITRIC ACID 500-334 MG/5ML PO SOLN: 30.0000 mL | ORAL | Status: DC | PRN

## 2021-06-27 MED ORDER — PENICILLIN G POT IN DEXTROSE 60000 UNIT/ML IV SOLN
3.0000 10*6.[IU] | INTRAVENOUS | Status: DC
  Administered 2021-06-27 – 2021-06-28 (×6): 3 10*6.[IU] via INTRAVENOUS
  Filled 2021-06-27 (×7): qty 50

## 2021-06-27 MED ORDER — OXYTOCIN-SODIUM CHLORIDE 30-0.9 UT/500ML-% IV SOLN: 2.5000 [IU]/h | INTRAVENOUS | Status: DC

## 2021-06-27 NOTE — Progress Notes (Signed)
Evanee Square is a 26 y.o. G1P0000 at [redacted]w[redacted]d by ultrasound admitted for induction of labor due to [facility].  Subjective: Comfortable with CLEA.  Objective: BP 134/88    Pulse 65    Temp 97.9 F (36.6 C) (Oral)    Resp 18    Ht 5\' 4"  (1.626 m)    Wt 103.3 kg    LMP 10/04/2020    SpO2 99%    BMI 39.10 kg/m  No intake/output data recorded. No intake/output data recorded.  FHT:  FHR: 120 bpm, variability: moderate,  accelerations:  Present,  decelerations:  Present repetitive variable decelerations; spontaneous decel x 5 minutes which resolved with position change UC:   difficult to trace SVE:   7/80/-1, IUPC placed  Labs: Lab Results  Component Value Date   WBC 16.9 (H) 06/27/2021   HGB 11.6 (L) 06/27/2021   HCT 35.2 (L) 06/27/2021   MCV 83.6 06/27/2021   PLT 234 06/27/2021    Assessment / Plan: Induction of labor for GHTN  Labor:  protracted active phase.  Will restart pitocin Preeclampsia:   n/a Fetal Wellbeing:   Category 2 with improvement after IUPC placement.  Will amnioinfuse if variable decelerations recur. Pain Control:  Epidural I/D:  n/a Anticipated MOD:  NSVD  Linda Hedges 06/27/2021, 10:49 PM

## 2021-06-27 NOTE — Anesthesia Procedure Notes (Signed)
Epidural Patient location during procedure: OB Start time: 06/27/2021 4:17 PM End time: 06/27/2021 4:31 PM  Staffing Anesthesiologist: Lowella Curb, MD Performed: anesthesiologist   Preanesthetic Checklist Completed: patient identified, IV checked, site marked, risks and benefits discussed, surgical consent, monitors and equipment checked, pre-op evaluation and timeout performed  Epidural Patient position: sitting Prep: ChloraPrep Patient monitoring: heart rate, cardiac monitor, continuous pulse ox and blood pressure Approach: midline Location: L2-L3 Injection technique: LOR saline  Needle:  Needle type: Tuohy  Needle gauge: 17 G Needle length: 9 cm Needle insertion depth: 7 cm Catheter type: closed end flexible Catheter size: 20 Guage Catheter at skin depth: 11 cm Test dose: negative  Assessment Events: blood not aspirated, injection not painful, no injection resistance, no paresthesia and negative IV test  Additional Notes Epidural placed by SRNA under direct supervisionReason for block:procedure for pain

## 2021-06-27 NOTE — Progress Notes (Signed)
Karen Ellison is a 26 y.o. G1P0000 at [redacted]w[redacted]d by ultrasound admitted for induction of labor due to [facility].  Subjective: Sleeping after just receiving first dose of fentanyl.  Objective: BP 140/82    Pulse 76    Temp 98.4 F (36.9 C) (Oral)    Resp 17    Ht 5\' 4"  (1.626 m)    Wt 103.3 kg    LMP 10/04/2020    BMI 39.10 kg/m  No intake/output data recorded. No intake/output data recorded.  FHT:  FHR: 130 bpm, variability: moderate,  accelerations:  Present,  decelerations:  Absent UC:   irregular, every 2-5 minutes SVE:   Dilation: 1 Effacement (%): Thick Station: Ballotable Exam by:: [employer] RN  Labs: Lab Results  Component Value Date   WBC 16.9 (H) 06/27/2021   HGB 11.6 (L) 06/27/2021   HCT 35.2 (L) 06/27/2021   MCV 83.6 06/27/2021   PLT 234 06/27/2021    Assessment / Plan: Induction of labor for GHTN  Labor:  s/p 3 doses of misoprostol; recheck at 1534 Preeclampsia:   n/a GHTN: normal to low mild range BPs.  Asymptomatic. Fetal Wellbeing:  Category I Pain Control:  Labor support without medications I/D:  n/a Anticipated MOD:  NSVD  Linda Hedges 06/27/2021, 1:58 PM

## 2021-06-27 NOTE — Anesthesia Preprocedure Evaluation (Signed)
Anesthesia Evaluation  Patient identified by MRN, date of birth, ID band Patient awake    Reviewed: Allergy & Precautions, NPO status , Patient's Chart, lab work & pertinent test results  Airway Mallampati: II  TM Distance: >3 FB Neck ROM: Full    Dental no notable dental hx.    Pulmonary neg pulmonary ROS,    Pulmonary exam normal breath sounds clear to auscultation       Cardiovascular hypertension, Pt. on medications negative cardio ROS Normal cardiovascular exam Rhythm:Regular Rate:Normal     Neuro/Psych Depression negative neurological ROS  negative psych ROS   GI/Hepatic negative GI ROS, Neg liver ROS,   Endo/Other  negative endocrine ROS  Renal/GU negative Renal ROS  negative genitourinary   Musculoskeletal negative musculoskeletal ROS (+)   Abdominal (+) + obese,   Peds negative pediatric ROS (+)  Hematology negative hematology ROS (+)   Anesthesia Other Findings   Reproductive/Obstetrics (+) Pregnancy                             Anesthesia Physical Anesthesia Plan  ASA: 2  Anesthesia Plan: Epidural   Post-op Pain Management:    Induction:   PONV Risk Score and Plan:   Airway Management Planned:   Additional Equipment:   Intra-op Plan:   Post-operative Plan:   Informed Consent:   Plan Discussed with:   Anesthesia Plan Comments:         Anesthesia Quick Evaluation

## 2021-06-27 NOTE — Progress Notes (Signed)
RN was unable to feel presenting part on last SVE. Bedside u/s was done and confirmed vtx presentation Will continue with misoprostol dose #3; patient prefers po  Linda Hedges, DO

## 2021-06-27 NOTE — Progress Notes (Signed)
Jaedyn Leight is a 26 y.o. G1P0000 at [redacted]w[redacted]d by ultrasound admitted for induction of labor due to [facility].  Subjective: Comfortable with CLEA  Objective: BP 113/61    Pulse 74    Temp 97.9 F (36.6 C) (Oral)    Resp 18    Ht 5\' 4"  (1.626 m)    Wt 103.3 kg    LMP 10/04/2020    SpO2 99%    BMI 39.10 kg/m  No intake/output data recorded. No intake/output data recorded.  FHT:  FHR: 135 bpm, variability: moderate,  accelerations:  Present,  decelerations:  Absent UC:   regular, every 2-3 minutes SVE:   6/75/-2, AROM clear  Labs: Lab Results  Component Value Date   WBC 16.9 (H) 06/27/2021   HGB 11.6 (L) 06/27/2021   HCT 35.2 (L) 06/27/2021   MCV 83.6 06/27/2021   PLT 234 06/27/2021    Assessment / Plan: Induction of labor for GHTN  Labor: Progressing normally and s/p AROM.  Will add pitocin as needed Preeclampsia:   n/a Fetal Wellbeing:  Category I Pain Control:  Epidural I/D:  n/a Anticipated MOD:  NSVD  Linda Hedges 06/27/2021, 7:41 PM

## 2021-06-27 NOTE — H&P (Signed)
Karen Ellison is a 26 y.o. female G1 at [redacted]w[redacted]d presenting for IOL for GHTN.  Patient received her second dose of misoprostol at 0615 and is feeling no CTX.  Patient denies HA, vision change, RUQ pain, CP/SOB.  Admission labs are wnl.  Patient has h/o depression and has taken sertraline 50 mg this pregnancy.  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  They counseled patient that this is a cold agglutinin and recommended no changes in management except third trimester u/s.  U/s was on 2/2 and showed normal fluid and growth; EFW 5#15 (50%).  On admission, antibody screen is negative.  Rubella NI.  GBS positive.    OB History     Gravida  1   Para  0   Term  0   Preterm  0   AB  0   Living  0      SAB  0   IAB  0   Ectopic  0   Multiple  0   Live Births  0          Past Medical History:  Diagnosis Date   Bacterial vaginosis    Depression    Hypertension    Past Surgical History:  Procedure Laterality Date   WISDOM TOOTH EXTRACTION     Family History: family history includes Cancer in her maternal grandmother, paternal aunt, and paternal uncle; Drug abuse in her sister; Hypertension in her father; Miscarriages / [place] in her mother. Social History:  reports that she has never smoked. She has never used smokeless tobacco. She reports that she does not currently use alcohol. She reports that she does not use drugs.     Maternal Diabetes: No Genetic Screening: Normal Maternal Ultrasounds/Referrals: Normal Fetal Ultrasounds or other Referrals:  Referred to Materal Fetal Medicine  for pos ab screen Maternal Substance Abuse:  No Significant Maternal Medications:  Meds include: Zoloft Significant Maternal Lab Results:  Group B Strep positive Other Comments:  None  Review of Systems Maternal Medical History:  Contractions: Frequency: rare.   Perceived severity is mild.   Fetal activity: Perceived fetal  activity is normal.   Last perceived fetal movement was within the past hour.   Prenatal complications: PIH.   Prenatal Complications - Diabetes: none.  Dilation: Fingertip Effacement (%): 50 Station: Ballotable Exam by:: [employer], RN Blood pressure 134/82, pulse 81, temperature 98.3 F (36.8 C), temperature source Oral, resp. rate 16, height 5\' 4"  (1.626 m), weight 103.3 kg, last menstrual period 10/04/2020. Maternal Exam:  Uterine Assessment: Contraction strength is mild.  Contraction frequency is rare.  Abdomen: Patient reports no abdominal tenderness. Fundal height is c/w dates.   Estimated fetal weight is 7#.     Fetal Exam Fetal Monitor Review: Baseline rate: 125.  Variability: moderate (6-25 bpm).   Pattern: accelerations present and no decelerations.   Fetal State Assessment: Category I - tracings are normal.  Physical Exam Constitutional:      Appearance: Normal appearance.  HENT:     Head: Normocephalic and atraumatic.  Pulmonary:     Effort: Pulmonary effort is normal.  Abdominal:     Palpations: Abdomen is soft.  Musculoskeletal:        General: Normal range of motion.     Cervical back: Normal range of motion.  Skin:    General: Skin is warm and dry.  Neurological:     Mental Status: She is alert and oriented  to person, place, and time.  Psychiatric:        Mood and Affect: Mood normal.        Behavior: Behavior normal.    Prenatal labs: ABO, Rh: --/--/A NEG (02/14 0023) Antibody: NEG (02/14 0023) Rubella: Nonimmune (08/04 0000) RPR: NON REACTIVE (02/14 0023)  HBsAg: Negative (08/04 0000)  HIV: Non-reactive (08/04 0000)  GBS: Positive/-- (02/06 0000)   Assessment/Plan: 26yo G1 at [redacted]w[redacted]d for IOL secondary to [facility] -GHTN-normal labs and occasional mild range BP.  No symptoms of pre-eclampsia.  Will continue to monitor.   -GBS positive-PCN -Recheck cervix at 1015 -OK to have light meal at this time; then clear liquid -Rubella NI-pp MMG -CLEA if  desired -Anticipate NSVD -Continue sertraline 50 mg   Mitchel Honour 06/27/2021, 8:35 AM

## 2021-06-28 ENCOUNTER — Encounter (HOSPITAL_COMMUNITY): Payer: Self-pay | Admitting: Obstetrics & Gynecology

## 2021-06-28 DIAGNOSIS — O139 Gestational [pregnancy-induced] hypertension without significant proteinuria, unspecified trimester: Secondary | ICD-10-CM | POA: Diagnosis present

## 2021-06-28 LAB — COMPREHENSIVE METABOLIC PANEL
ALT: 13 U/L (ref 0–44)
AST: 28 U/L (ref 15–41)
Albumin: 2.1 g/dL — ABNORMAL LOW (ref 3.5–5.0)
Alkaline Phosphatase: 150 U/L — ABNORMAL HIGH (ref 38–126)
Anion gap: 8 (ref 5–15)
BUN: 7 mg/dL (ref 6–20)
CO2: 21 mmol/L — ABNORMAL LOW (ref 22–32)
Calcium: 8.4 mg/dL — ABNORMAL LOW (ref 8.9–10.3)
Chloride: 103 mmol/L (ref 98–111)
Creatinine, Ser: 0.89 mg/dL (ref 0.44–1.00)
GFR, Estimated: >60 mL/min (ref >60)
Glucose, Bld: 126 mg/dL — ABNORMAL HIGH (ref 70–99)
Potassium: 3.7 mmol/L (ref 3.5–5.1)
Sodium: 132 mmol/L — ABNORMAL LOW (ref 135–145)
Total Bilirubin: <0.1 mg/dL — ABNORMAL LOW (ref 0.3–1.2)
Total Protein: 5.5 g/dL — ABNORMAL LOW (ref 6.5–8.1)

## 2021-06-28 LAB — CBC
HCT: 33.7 % — ABNORMAL LOW (ref 36.0–46.0)
Hemoglobin: 11.2 g/dL — ABNORMAL LOW (ref 12.0–15.0)
MCH: 27.9 pg (ref 26.0–34.0)
MCHC: 33.2 g/dL (ref 30.0–36.0)
MCV: 83.8 fL (ref 80.0–100.0)
Platelets: 215 10*3/uL (ref 150–400)
RBC: 4.02 MIL/uL (ref 3.87–5.11)
RDW: 13.9 % (ref 11.5–15.5)
WBC: 19.6 10*3/uL — ABNORMAL HIGH (ref 4.0–10.5)
nRBC: 0 % (ref 0.0–0.2)

## 2021-06-28 MED ORDER — OXYTOCIN-SODIUM CHLORIDE 30-0.9 UT/500ML-% IV SOLN
1.0000 m[IU]/min | INTRAVENOUS | Status: DC
  Administered 2021-06-28: 1 m[IU]/min via INTRAVENOUS

## 2021-06-28 MED ORDER — SIMETHICONE 80 MG PO CHEW: 80.0000 mg | CHEWABLE_TABLET | ORAL | Status: DC | PRN

## 2021-06-28 MED ORDER — IBUPROFEN 600 MG PO TABS
600.0000 mg | ORAL_TABLET | Freq: Four times a day (QID) | ORAL | Status: DC
  Administered 2021-06-28 – 2021-06-30 (×8): 600 mg via ORAL
  Filled 2021-06-28 (×8): qty 1

## 2021-06-28 MED ORDER — TETANUS-DIPHTH-ACELL PERTUSSIS 5-2.5-18.5 LF-MCG/0.5 IM SUSY: 0.5000 mL | PREFILLED_SYRINGE | Freq: Once | INTRAMUSCULAR | Status: DC

## 2021-06-28 MED ORDER — ACETAMINOPHEN 325 MG PO TABS: 650.0000 mg | ORAL_TABLET | ORAL | Status: DC | PRN

## 2021-06-28 MED ORDER — BENZOCAINE-MENTHOL 20-0.5 % EX AERO
1.0000 "application " | INHALATION_SPRAY | CUTANEOUS | Status: DC | PRN
  Filled 2021-06-28: qty 56

## 2021-06-28 MED ORDER — ONDANSETRON HCL 4 MG PO TABS: 4.0000 mg | ORAL_TABLET | ORAL | Status: DC | PRN

## 2021-06-28 MED ORDER — DIBUCAINE (PERIANAL) 1 % EX OINT: 1.0000 "application " | TOPICAL_OINTMENT | CUTANEOUS | Status: DC | PRN

## 2021-06-28 MED ORDER — SENNOSIDES-DOCUSATE SODIUM 8.6-50 MG PO TABS
2.0000 | ORAL_TABLET | Freq: Every day | ORAL | Status: DC
  Administered 2021-06-29 – 2021-06-30 (×2): 2 via ORAL
  Filled 2021-06-28 (×2): qty 2

## 2021-06-28 MED ORDER — WITCH HAZEL-GLYCERIN EX PADS: 1.0000 "application " | MEDICATED_PAD | CUTANEOUS | Status: DC | PRN

## 2021-06-28 MED ORDER — DIPHENHYDRAMINE HCL 25 MG PO CAPS
25.0000 mg | ORAL_CAPSULE | Freq: Four times a day (QID) | ORAL | Status: DC | PRN
Start: 2021-06-28 — End: 2021-06-30
  Administered 2021-06-28: 25 mg via ORAL
  Filled 2021-06-28: qty 1

## 2021-06-28 MED ORDER — OXYCODONE-ACETAMINOPHEN 5-325 MG PO TABS
1.0000 | ORAL_TABLET | ORAL | Status: DC | PRN
  Administered 2021-06-29 – 2021-06-30 (×5): 1 via ORAL
  Filled 2021-06-28 (×5): qty 1

## 2021-06-28 MED ORDER — PRENATAL MULTIVITAMIN CH
1.0000 | ORAL_TABLET | Freq: Every day | ORAL | Status: DC
  Administered 2021-06-28 – 2021-06-29 (×2): 1 via ORAL
  Filled 2021-06-28 (×2): qty 1

## 2021-06-28 MED ORDER — COCONUT OIL OIL: 1.0000 "application " | TOPICAL_OIL | Status: DC | PRN

## 2021-06-28 MED ORDER — ONDANSETRON HCL 4 MG/2ML IJ SOLN: 4.0000 mg | INTRAMUSCULAR | Status: DC | PRN

## 2021-06-28 MED ORDER — OXYCODONE-ACETAMINOPHEN 5-325 MG PO TABS
2.0000 | ORAL_TABLET | ORAL | Status: DC | PRN
  Administered 2021-06-28: 2 via ORAL
  Filled 2021-06-28: qty 2

## 2021-06-28 MED ORDER — RHO D IMMUNE GLOBULIN 1500 UNIT/2ML IJ SOSY
300.0000 ug | PREFILLED_SYRINGE | Freq: Once | INTRAMUSCULAR | Status: AC
  Administered 2021-06-29: 300 ug via INTRAVENOUS
  Filled 2021-06-28: qty 2

## 2021-06-28 MED ORDER — SERTRALINE HCL 25 MG PO TABS
25.0000 mg | ORAL_TABLET | Freq: Every day | ORAL | Status: DC
  Administered 2021-06-28 – 2021-06-30 (×3): 25 mg via ORAL
  Filled 2021-06-28 (×3): qty 1

## 2021-06-28 MED ORDER — ZOLPIDEM TARTRATE 5 MG PO TABS: 5.0000 mg | ORAL_TABLET | Freq: Every evening | ORAL | Status: DC | PRN

## 2021-06-28 NOTE — Lactation Note (Addendum)
This note was copied from a baby's chart. Lactation Consultation Note  Patient Name: Karen Ellison [id number] Date: 06/28/2021   Age:28 hours  Mom & baby seen in L&D. Infant was actively cueing. Using the teacup hold, I assisted in getting infant to latch. Infant would sometimes "lose" latch, but would then actively search to relatch. Infant still at breast when I left room 15 min later. Dad was shown how to assist Mom in latching. Excellent tongue movement noted with cueing.  Mom was easily able to express colostrum from her L breast. Parents are aware that 37 week infants need extra surveillance in regards to feeding ability, maintaining energy levels, etc. They will be having 3 lactation home visits after d/c from hospital.  Lactation to f/u again later today.    Lurline Hare [facility] 06/28/2021, 8:05 AM

## 2021-06-28 NOTE — Progress Notes (Signed)
Karen Ellison is a 26 y.o. G1P0000 at [redacted]w[redacted]d by ultrasound admitted for induction of labor due to [facility].  Subjective: Comfortable with CLEA.  No HA, vision change, RUQ pain, CP/SOB  Objective: BP 115/80    Pulse 76    Temp 97.9 F (36.6 C) (Oral)    Resp 18    Ht 5\' 4"  (1.626 m)    Wt 103.3 kg    LMP 10/04/2020    SpO2 99%    BMI 39.10 kg/m  No intake/output data recorded. No intake/output data recorded.  FHT:  FHR: 135 bpm, variability: moderate,  accelerations:  Abscent,  decelerations:  Present variables which improve with position change , prolonged decel 25 minutes ago which lasted 2 minutes and improved with position change to baseline UC:   irregular, every 2-5 minutes SVE:   Dilation: 9 Effacement (%): 100 Station: 0 Exam by:: Dr. [email or web address]  Labs: Lab Results  Component Value Date   WBC 16.9 (H) 06/27/2021   HGB 11.6 (L) 06/27/2021   HCT 35.2 (L) 06/27/2021   MCV 83.6 06/27/2021   PLT 234 06/27/2021    Assessment / Plan: Induction of labor due to gestational hypertension,  progressing well on pitocin  Labor: Progressing normally Preeclampsia:   n/a Fetal Wellbeing:  Category I and Category II Pain Control:  Epidural I/D:  n/a Anticipated MOD:  NSVD  Cairo Agostinelli 06/28/2021, 1:32 AM

## 2021-06-28 NOTE — Lactation Note (Signed)
This note was copied from a baby's chart. Lactation Consultation Note  Patient Name: Girl Moon Budde [id number] Date: 06/28/2021 Reason for consult: Follow-up assessment;Early term 37-38.6wks;Primapara Age:28 hours  A small amount of colostrum was obtained with hand expression; only drops on flanges with the DEBP (I explained the importance of stimulating the breasts).  An additional latch was attempted, but then infant fell asleep at the breast.   Lurline Hare [facility] 06/28/2021, 11:51 AM

## 2021-06-28 NOTE — Lactation Note (Signed)
This note was copied from a baby's chart. Lactation Consultation Note  Patient Name: Girl Francheska Jeffus [id number] Date: 06/28/2021   Age:28 hours   Visit on MBU attempted. Mom is trying to rest & infant is currently sleeping in MGF's arms.   I asked Mom to call me when she is ready for me to return so we can set up a DEBP. Mom verbalized understanding.   Matthias Hughs [facility] 06/28/2021, 9:56 AM

## 2021-06-28 NOTE — Anesthesia Postprocedure Evaluation (Signed)
Anesthesia Post Note  Patient: Karen Ellison  Procedure(s) Performed: AN AD HOC LABOR EPIDURAL     Patient location during evaluation: Mother Baby Anesthesia Type: Epidural Level of consciousness: awake Pain management: satisfactory to patient Vital Signs Assessment: post-procedure vital signs reviewed and stable Respiratory status: spontaneous breathing Cardiovascular status: stable Anesthetic complications: no   No notable events documented.  Last Vitals:  Vitals:   06/28/21 0840 06/28/21 1012  BP: 131/85 122/87  Pulse: 78 64  Resp: 18 18  Temp: 36.4 C 37.1 C  SpO2: 96% 99%    Last Pain:  Vitals:   06/28/21 1012  TempSrc: Oral  PainSc: 0-No pain   Pain Goal:                   [employer]

## 2021-06-28 NOTE — Lactation Note (Signed)
This note was copied from a baby's chart. Lactation Consultation Note  Patient Name: Karen Ellison [id number] Date: 06/28/2021   Age:28 hours  Parents were ready for me to return as "Karen Ellison" was cueing. Infant was put to the R breast, but immediately fell asleep. Parents were educated about initial, sleepy newborn behavior. Mom is easily able to express drops of colostrum with stimulation. L areola has a mark where infant missed latch previously.   Mom was ready to be and was set up with a DEBP. I will return to room after pumping has finished. Parents are aware that we have DBM, if needed.   Mom has a Spectra pump and a "portable" pump at home. Mom is aware she may need size 21 flanges for her Spectra pump.   Lurline Hare [facility] 06/28/2021, 10:31 AM

## 2021-06-28 NOTE — Consult Note (Signed)
Neonatology Note: ° °Attendance at [facility]: ° ° Our team responded to a [facility] call to room # 206 following NSVD, due to infant with apnea. The requesting physician was Dr. Morris. The mother is a G1, GBS pos aIAP with GHTN and variable FHTs. ROM occurred 10h 50m prior to delivery and the fluid was clear.  At delivery, the baby fair tone with some gasping but then did not transition well so cord cut and NRP initiated on warmer. HR initially ~120 then began dropping.  BBO2 initiated then PPV.  The OB nursing staff in attendance gave vigorous stimulation and the code was called. HR responded well and CPAP then BBO2 continued. Our team arrived at ~3 minutes of life, at which time the baby was on RA, mildly hypotonic, pink, breathing comfortably and with intermittent cry. SAO2 reading appropriately in 90s with nl HR. Stayed for ~8 minutes to watch transition further.  VSS with improved tone and cry.  Ap 4/8.  I spoke with the parents in the DR, then transferred the baby to the Pediatrician’s care.  ° °Please do not hesitate in contacting us if further concerns.  ° °Chloris Marcoux C. Nicolas Sisler, MD °  °

## 2021-06-29 LAB — CBC
HCT: 31.7 % — ABNORMAL LOW (ref 36.0–46.0)
Hemoglobin: 10.1 g/dL — ABNORMAL LOW (ref 12.0–15.0)
MCH: 27.4 pg (ref 26.0–34.0)
MCHC: 31.9 g/dL (ref 30.0–36.0)
MCV: 86.1 fL (ref 80.0–100.0)
Platelets: 200 10*3/uL (ref 150–400)
RBC: 3.68 MIL/uL — ABNORMAL LOW (ref 3.87–5.11)
RDW: 14.4 % (ref 11.5–15.5)
WBC: 14.8 10*3/uL — ABNORMAL HIGH (ref 4.0–10.5)
nRBC: 0 % (ref 0.0–0.2)

## 2021-06-29 NOTE — Lactation Note (Addendum)
This note was copied from a baby's chart. Lactation Consultation Note  Patient Name: Karen Ellison [id number] Date: 06/29/2021 Reason for consult: Follow-up assessment;Early term 37-38.6wks;Primapara;1st time breastfeeding Age:28 hours   [name] whose infant is now 9 hours old.  This is an ETI at 37+2 weeks weighing < 6 lbs.  Mother's current feeding preference is breast/donor breast milk.  Parents have supplemented with formula when donor milk supply ran out.  Family now has a large bottle of donor milk for "August."  Mother reported that "August" is not latching for long before she gets sleepy.  Encouraged her to continue to attempt latching prior to supplementation and to call her RN/LC for assistance as needed.  Discussed techniques to help awaken baby including STS, diaper change and gentle stimulation.  Mother has been feeding her swaddled.  Reviewed breast feeding basics and encouraged hand expression before/after feeding and pumping to help ensure a good milk supply.  Mother verbalized understanding.  Father requested a different slower flow nipple; provided the Nfant white nipple; parents to alert RN for any difficulty with this nipple.  Suggested parents feed at least every three hours due to gestational age and weight.  Allow baby to feed more often if cueing.  Discussed supplementation volumes and parents will increase volume.  They feel like she would take more; suggested more volume and continue to frequently burp.  Provided coconut oil for nipple care and reviewed pump set up.  Gave mother a warm blanket per her request.  Father and visitor very supportive.   Maternal Data Has patient been taught Hand Expression?: Yes Does the patient have breastfeeding experience prior to this delivery?: No  Feeding Mother's Current Feeding Choice: Breast Milk and Donor Milk Nipple Type: Extra Slow Flow  LATCH Score                    Lactation Tools  Discussed/Used Tools: Pump;Flanges;Coconut oil Flange Size: 24;27 Breast pump type: Double-Electric Breast Pump;Manual Pump Education: Setup, frequency, and cleaning (Reviewed) Reason for Pumping: Breast stimulation for supplementation; baby ETI and < 6 lbs Pumping frequency: Every three hours Pumped volume:  (No drops yet)  Interventions Interventions: Breast feeding basics reviewed;Education  Discharge Pump: DEBP;Manual;Personal (Spectra and a portable pump) WIC Program: No  Consult Status Consult Status: Follow-up Date: 06/30/21 Follow-up type: In-patient    Ksean Vale R Saban Heinlen 06/29/2021, 1:09 PM

## 2021-06-29 NOTE — Progress Notes (Signed)
Post Partum Day 1 Subjective: up ad lib, voiding, tolerating PO, + flatus, and low back pain and cramping  Objective: Blood pressure 114/90, pulse 69, temperature 98 F (36.7 C), temperature source Oral, resp. rate 18, height 5\' 4"  (1.626 m), weight 103.3 kg, last menstrual period 10/04/2020, SpO2 98 %, unknown if currently breastfeeding.  Physical Exam:  General: alert, cooperative, and mild distress Lochia: appropriate Uterine Fundus: firm Incision: healing well DVT Evaluation: No evidence of DVT seen on physical exam.  Recent Labs    06/27/21 1014 06/28/21 0751  HGB 11.6* 11.2*  HCT 35.2* 33.7*    Assessment/Plan: Plan for discharge tomorrow   LOS: 2 days   Shon Millet II 06/29/2021, 6:40 AM

## 2021-06-30 LAB — RH IG WORKUP (INCLUDES ABO/RH)
Fetal Screen: NEGATIVE
Gestational Age(Wks): 37.2
Unit division: 0

## 2021-06-30 LAB — SURGICAL PATHOLOGY

## 2021-06-30 MED ORDER — IBUPROFEN 600 MG PO TABS: 600.0000 mg | ORAL_TABLET | Freq: Four times a day (QID) | ORAL | 0 refills | Status: AC

## 2021-06-30 MED ORDER — MEASLES, MUMPS & RUBELLA VAC IJ SOLR
0.5000 mL | Freq: Once | INTRAMUSCULAR | Status: AC
  Administered 2021-06-30: 0.5 mL via SUBCUTANEOUS
  Filled 2021-06-30: qty 0.5

## 2021-06-30 MED ORDER — OXYCODONE-ACETAMINOPHEN 5-325 MG PO TABS: 1.0000 | ORAL_TABLET | ORAL | 0 refills | Status: AC | PRN

## 2021-06-30 NOTE — Lactation Note (Signed)
This note was copied from a baby's chart. Lactation Consultation Note  Patient Name: Karen Ellison [id number] Date: 06/30/2021 Reason for consult: Follow-up assessment;Early term 37-38.6wks;1st time breastfeeding;Primapara Age:28 hours   [name] whose infant is now 28 hours old.  This is an ETI at 37+2 weeks weighing < 6 lbs.  Mother's current feeding preference is breast/donor breast milk.  Parents have supplemented with formula also.  Family has been discharged.  Reviewed feeding plan for after discharge.  Parents have been very receptive to all teaching completed yesterday; baby has increased supplementation volumes as discussed.  Father reports no difficulty with bottle feeding.  Mother will continue to practice latching "August."  Asked for a NS to use when her in home [facility] comes tomorrow for a visit.  Provided a #20 and offered to size, however, mother will rely on her [facility] tomorrow.  Explained the importance of correct sizing and to begin pumping after every feeding when using a NS.  Mother verbalized understanding.  Continue to feed at least every three hours due to gestational age and weight; supplement with 30+ mls at every feeding until mother/baby are breast feeding well.  Parents have our OP phone number for any questions/concerns after discharge.     Maternal Data    Feeding Mother's Current Feeding Choice: Breast Milk Nipple Type: Nfant Slow Flow (purple)  LATCH Score                    Lactation Tools Discussed/Used    Interventions    Discharge Discharge Education: Engorgement and breast care Pump: Personal;Manual;DEBP  Consult Status Consult Status: Complete Date: 06/30/21 Follow-up type: Call as needed    Prajna Vanderpool R Shania Bjelland 06/30/2021, 9:09 AM

## 2021-06-30 NOTE — Discharge Summary (Signed)
Postpartum Discharge Summary  Date of Service updated June 30, 2021     Patient Name: Karen Ellison DOB: December 26, 1995 MRN: [id number]  Date of admission: 06/27/2021 Delivery date:06/28/2021  Delivering provider: Linda Hedges  Date of discharge: 06/30/2021  Admitting diagnosis: Pregnancy [Z34.90] Gestational hypertension [O13.9] Intrauterine pregnancy: [redacted]w[redacted]d    Secondary diagnosis:  Principal Problem:   Pregnancy Active Problems:   Gestational hypertension  Additional problems: none    Discharge diagnosis: Term Pregnancy Delivered                                              Post partum procedures: none Augmentation: AROM, Pitocin, and Cytotec Complications: None  Hospital course: Induction of Labor With Vaginal Delivery   26y.o. yo G1P1001 at 363w2das admitted to the hospital 06/27/2021 for induction of labor.  Indication for induction: Gestational hypertension.  Patient had an uncomplicated labor course as follows: Membrane Rupture Time/Date: 7:38 PM ,06/27/2021   Delivery Method:Vaginal, Spontaneous  Episiotomy: Median  Lacerations:  None  Details of delivery can be found in separate delivery note.  Patient had a routine postpartum course. Patient is discharged home 06/30/21.  Newborn Data: Birth date:06/28/2021  Birth time:6:28 AM  Gender:Female  Living status:Living  Apgars:4 ,8  Weight:2710 g   Magnesium Sulfate received: No BMZ received: No Rhophylac:N/A MMR:N/A T-DaP:Given prenatally Flu: N/A Transfusion:No  Physical exam  Vitals:   06/29/21 0532 06/29/21 1425 06/29/21 2045 06/30/21 0556  BP: 114/90 126/83 116/85 108/78  Pulse: 69 81 85 83  Resp: '18 16 18 18  ' Temp:  97.6 F (36.4 C) 97.8 F (36.6 C)   TempSrc: Oral Oral Oral Oral  SpO2: 98% 97% 97% 98%  Weight:      Height:       General: alert, cooperative, and no distress Lochia: appropriate Uterine Fundus: firm Incision: Healing well with no significant drainage DVT Evaluation: No  evidence of DVT seen on physical exam. Labs: Lab Results  Component Value Date   WBC 14.8 (H) 06/29/2021   HGB 10.1 (L) 06/29/2021   HCT 31.7 (L) 06/29/2021   MCV 86.1 06/29/2021   PLT 200 06/29/2021   CMP Latest Ref Rng & Units 06/28/2021  Glucose 70 - 99 mg/dL 126(H)  BUN 6 - 20 mg/dL 7  Creatinine 0.44 - 1.00 mg/dL 0.89  Sodium 135 - 145 mmol/L 132(L)  Potassium 3.5 - 5.1 mmol/L 3.7  Chloride 98 - 111 mmol/L 103  CO2 22 - 32 mmol/L 21(L)  Calcium 8.9 - 10.3 mg/dL 8.4(L)  Total Protein 6.5 - 8.1 g/dL 5.5(L)  Total Bilirubin 0.3 - 1.2 mg/dL <0.1(L)  Alkaline Phos 38 - 126 U/L 150(H)  AST 15 - 41 U/L 28  ALT 0 - 44 U/L 13   Edinburgh Score: Edinburgh Postnatal Depression Scale Screening Tool 06/28/2021  I have been able to laugh and see the funny side of things. 0  I have looked forward with enjoyment to things. 0  I have blamed myself unnecessarily when things went wrong. 0  I have been anxious or worried for no good reason. 0  I have felt scared or panicky for no good reason. 0  Things have been getting on top of me. 0  I have been so unhappy that I have had difficulty sleeping. 0  I have felt sad or miserable. 0  I have been so unhappy that I have been crying. 0  The thought of harming myself has occurred to me. 0  Edinburgh Postnatal Depression Scale Total 0      After visit meds:  Allergies as of 06/30/2021       Reactions   Metrogel [metronidazole] Other (See Comments)   Confusion and vomiting        Medication List     TAKE these medications    ibuprofen 600 MG tablet Commonly known as: ADVIL Take 1 tablet (600 mg total) by mouth every 6 (six) hours.   oxyCODONE-acetaminophen 5-325 MG tablet Commonly known as: PERCOCET/ROXICET Take 1 tablet by mouth every 4 (four) hours as needed (pain scale 4-7).   prenatal multivitamin Tabs tablet Take 1 tablet by mouth daily at 12 noon.   sertraline 25 MG tablet Commonly known as: ZOLOFT Take 25 mg by mouth  daily.         Discharge home in stable condition Infant Feeding: Breast Infant Disposition:home with mother Discharge instruction: per After Visit Summary and Postpartum booklet. Activity: Advance as tolerated. Pelvic rest for 6 weeks.  Diet: routine diet Anticipated Birth Control: Unsure Postpartum Appointment:6 weeks Additional Postpartum F/U:  none Future Appointments:No future appointments. Follow up Visit:      06/30/2021 Cyril Mourning, MD

## 2021-07-10 ENCOUNTER — Telehealth (HOSPITAL_COMMUNITY): Payer: Self-pay | Admitting: *Deleted

## 2021-07-10 NOTE — Telephone Encounter (Signed)
Phone voicemail message left to return nurse call.  Duffy Rhody, RN 07-10-2021 at 9:17am

## 2021-07-17 ENCOUNTER — Inpatient Hospital Stay (HOSPITAL_COMMUNITY)
Admission: RE | Admit: 2021-07-17 | Payer: BC Managed Care – PPO | Source: Home / Self Care | Admitting: Obstetrics & Gynecology

## 2021-08-07 DIAGNOSIS — Z1389 Encounter for screening for other disorder: Secondary | ICD-10-CM | POA: Diagnosis not present

## 2021-09-26 DIAGNOSIS — J309 Allergic rhinitis, unspecified: Secondary | ICD-10-CM | POA: Diagnosis not present

## 2022-02-20 DIAGNOSIS — R070 Pain in throat: Secondary | ICD-10-CM | POA: Diagnosis not present

## 2022-02-20 DIAGNOSIS — R509 Fever, unspecified: Secondary | ICD-10-CM | POA: Diagnosis not present

## 2022-02-20 DIAGNOSIS — B349 Viral infection, unspecified: Secondary | ICD-10-CM | POA: Diagnosis not present

## 2022-02-20 DIAGNOSIS — J029 Acute pharyngitis, unspecified: Secondary | ICD-10-CM | POA: Diagnosis not present

## 2022-10-19 ENCOUNTER — Other Ambulatory Visit (HOSPITAL_COMMUNITY): Payer: Self-pay

## 2022-10-22 ENCOUNTER — Other Ambulatory Visit (HOSPITAL_BASED_OUTPATIENT_CLINIC_OR_DEPARTMENT_OTHER): Payer: Self-pay

## 2022-10-22 ENCOUNTER — Other Ambulatory Visit (HOSPITAL_COMMUNITY): Payer: Self-pay

## 2022-10-22 MED ORDER — ZEPBOUND 5 MG/0.5ML ~~LOC~~ SOAJ
SUBCUTANEOUS | 0 refills | Status: AC
  Filled 2022-10-22 (×4): qty 2, 28d supply, fill #0

## 2022-10-23 ENCOUNTER — Other Ambulatory Visit (HOSPITAL_COMMUNITY): Payer: Self-pay

## 2022-11-21 ENCOUNTER — Other Ambulatory Visit (HOSPITAL_COMMUNITY): Payer: Self-pay

## 2022-11-21 MED ORDER — ZEPBOUND 7.5 MG/0.5ML ~~LOC~~ SOAJ
7.5000 mg | SUBCUTANEOUS | 0 refills | Status: AC
  Filled 2022-11-21: qty 2, 28d supply, fill #0

## 2022-12-19 ENCOUNTER — Other Ambulatory Visit (HOSPITAL_BASED_OUTPATIENT_CLINIC_OR_DEPARTMENT_OTHER): Payer: Self-pay

## 2022-12-20 ENCOUNTER — Other Ambulatory Visit (HOSPITAL_COMMUNITY): Payer: Self-pay

## 2022-12-20 MED ORDER — ZEPBOUND 7.5 MG/0.5ML ~~LOC~~ SOAJ
7.5000 mg | SUBCUTANEOUS | 0 refills | Status: AC
  Filled 2022-12-20 – 2022-12-24 (×2): qty 2, 28d supply, fill #0

## 2022-12-22 ENCOUNTER — Other Ambulatory Visit (HOSPITAL_COMMUNITY): Payer: Self-pay

## 2022-12-24 ENCOUNTER — Other Ambulatory Visit (HOSPITAL_COMMUNITY): Payer: Self-pay

## 2022-12-25 ENCOUNTER — Other Ambulatory Visit (HOSPITAL_COMMUNITY): Payer: Self-pay

## 2022-12-25 MED ORDER — ZEPBOUND 10 MG/0.5ML ~~LOC~~ SOAJ
10.0000 mg | SUBCUTANEOUS | 1 refills | Status: AC
  Filled 2022-12-25: qty 2, 28d supply, fill #0
  Filled 2023-01-22 (×2): qty 2, 28d supply, fill #1
  Filled 2023-02-18: qty 2, 28d supply, fill #2

## 2023-01-22 ENCOUNTER — Other Ambulatory Visit (HOSPITAL_COMMUNITY): Payer: Self-pay

## 2023-01-23 ENCOUNTER — Encounter (HOSPITAL_COMMUNITY): Payer: Self-pay

## 2023-01-23 ENCOUNTER — Other Ambulatory Visit (HOSPITAL_COMMUNITY): Payer: Self-pay

## 2023-02-18 ENCOUNTER — Other Ambulatory Visit (HOSPITAL_COMMUNITY): Payer: Self-pay

## 2023-02-19 ENCOUNTER — Encounter (HOSPITAL_COMMUNITY): Payer: Self-pay

## 2023-02-20 ENCOUNTER — Other Ambulatory Visit (HOSPITAL_COMMUNITY): Payer: Self-pay | Admitting: Family Medicine

## 2023-02-20 ENCOUNTER — Other Ambulatory Visit (HOSPITAL_COMMUNITY): Payer: Self-pay

## 2023-03-01 ENCOUNTER — Other Ambulatory Visit (HOSPITAL_COMMUNITY): Payer: Self-pay

## 2023-03-01 MED ORDER — ZEPBOUND 12.5 MG/0.5ML ~~LOC~~ SOAJ
12.5000 mg | SUBCUTANEOUS | 0 refills | Status: AC
  Filled 2023-03-01: qty 2, 28d supply, fill #0
  Filled 2023-03-27: qty 2, 28d supply, fill #1

## 2023-03-02 ENCOUNTER — Other Ambulatory Visit (HOSPITAL_BASED_OUTPATIENT_CLINIC_OR_DEPARTMENT_OTHER): Payer: Self-pay

## 2023-03-04 ENCOUNTER — Other Ambulatory Visit (HOSPITAL_COMMUNITY): Payer: Self-pay

## 2023-04-03 ENCOUNTER — Emergency Department (HOSPITAL_BASED_OUTPATIENT_CLINIC_OR_DEPARTMENT_OTHER)
Admission: EM | Admit: 2023-04-03 | Discharge: 2023-04-03 | Disposition: A | Discharge status: Home / Self Care | Payer: BC Managed Care – PPO | Attending: Emergency Medicine | Admitting: Emergency Medicine

## 2023-04-03 ENCOUNTER — Encounter (HOSPITAL_BASED_OUTPATIENT_CLINIC_OR_DEPARTMENT_OTHER): Payer: Self-pay | Admitting: Emergency Medicine

## 2023-04-03 ENCOUNTER — Other Ambulatory Visit: Payer: Self-pay

## 2023-04-03 DIAGNOSIS — R55 Syncope and collapse: Secondary | ICD-10-CM | POA: Diagnosis present

## 2023-04-03 DIAGNOSIS — D72829 Elevated white blood cell count, unspecified: Secondary | ICD-10-CM | POA: Diagnosis not present

## 2023-04-03 DIAGNOSIS — Z20822 Contact with and (suspected) exposure to covid-19: Secondary | ICD-10-CM | POA: Diagnosis not present

## 2023-04-03 DIAGNOSIS — E876 Hypokalemia: Secondary | ICD-10-CM | POA: Insufficient documentation

## 2023-04-03 DIAGNOSIS — J029 Acute pharyngitis, unspecified: Secondary | ICD-10-CM | POA: Insufficient documentation

## 2023-04-03 LAB — COMPREHENSIVE METABOLIC PANEL
ALT: 6 U/L (ref 0–44)
AST: 13 U/L — ABNORMAL LOW (ref 15–41)
Albumin: 4.3 g/dL (ref 3.5–5.0)
Alkaline Phosphatase: 62 U/L (ref 38–126)
Anion gap: 11 (ref 5–15)
BUN: 7 mg/dL (ref 6–20)
CO2: 25 mmol/L (ref 22–32)
Calcium: 9.6 mg/dL (ref 8.9–10.3)
Chloride: 101 mmol/L (ref 98–111)
Creatinine, Ser: 0.83 mg/dL (ref 0.44–1.00)
GFR, Estimated: >60 mL/min (ref >60)
Glucose, Bld: 113 mg/dL — ABNORMAL HIGH (ref 70–99)
Potassium: 3.2 mmol/L — ABNORMAL LOW (ref 3.5–5.1)
Sodium: 137 mmol/L (ref 135–145)
Total Bilirubin: 0.4 mg/dL (ref <1.2)
Total Protein: 7.6 g/dL (ref 6.5–8.1)

## 2023-04-03 LAB — URINALYSIS, ROUTINE W REFLEX MICROSCOPIC
Bilirubin Urine: NEGATIVE
Glucose, UA: NEGATIVE mg/dL
Hgb urine dipstick: NEGATIVE
Ketones, ur: 40 mg/dL — AB
Leukocytes,Ua: NEGATIVE
Nitrite: NEGATIVE
Protein, ur: NEGATIVE mg/dL
Specific Gravity, Urine: 1.012 (ref 1.005–1.030)
pH: 8 (ref 5.0–8.0)

## 2023-04-03 LAB — CBC WITH DIFFERENTIAL/PLATELET
Abs Immature Granulocytes: 0.09 10*3/uL — ABNORMAL HIGH (ref 0.00–0.07)
Basophils Absolute: 0 10*3/uL (ref 0.0–0.1)
Basophils Relative: 0 %
Eosinophils Absolute: 0.1 10*3/uL (ref 0.0–0.5)
Eosinophils Relative: 1 %
HCT: 41.5 % (ref 36.0–46.0)
Hemoglobin: 13.8 g/dL (ref 12.0–15.0)
Immature Granulocytes: 1 %
Lymphocytes Relative: 14 %
Lymphs Abs: 2.5 10*3/uL (ref 0.7–4.0)
MCH: 27.8 pg (ref 26.0–34.0)
MCHC: 33.3 g/dL (ref 30.0–36.0)
MCV: 83.7 fL (ref 80.0–100.0)
Monocytes Absolute: 0.8 10*3/uL (ref 0.1–1.0)
Monocytes Relative: 4 %
Neutro Abs: 14.7 10*3/uL — ABNORMAL HIGH (ref 1.7–7.7)
Neutrophils Relative %: 80 %
Platelets: 316 10*3/uL (ref 150–400)
RBC: 4.96 MIL/uL (ref 3.87–5.11)
RDW: 14.1 % (ref 11.5–15.5)
WBC: 18.1 10*3/uL — ABNORMAL HIGH (ref 4.0–10.5)
nRBC: 0 % (ref 0.0–0.2)

## 2023-04-03 LAB — PROTIME-INR
INR: 1.1 (ref 0.8–1.2)
Prothrombin Time: 14.3 s (ref 11.4–15.2)

## 2023-04-03 LAB — LACTIC ACID, PLASMA: Lactic Acid, Venous: 0.8 mmol/L (ref 0.5–1.9)

## 2023-04-03 LAB — GROUP A STREP BY PCR: Group A Strep by PCR: NOT DETECTED

## 2023-04-03 LAB — RESP PANEL BY RT-PCR (RSV, FLU A&B, COVID)  RVPGX2
Influenza A by PCR: NEGATIVE
Influenza B by PCR: NEGATIVE
Resp Syncytial Virus by PCR: NEGATIVE
SARS Coronavirus 2 by RT PCR: NEGATIVE

## 2023-04-03 LAB — HCG, QUANTITATIVE, PREGNANCY: hCG, Beta Chain, Quant, S: <1 m[IU]/mL (ref <5)

## 2023-04-03 LAB — APTT: aPTT: 33 s (ref 24–36)

## 2023-04-03 MED ORDER — POTASSIUM CHLORIDE CRYS ER 20 MEQ PO TBCR
40.0000 meq | EXTENDED_RELEASE_TABLET | Freq: Once | ORAL | Status: AC
  Administered 2023-04-03: 40 meq via ORAL
  Filled 2023-04-03: qty 2

## 2023-04-03 MED ORDER — LACTATED RINGERS IV BOLUS (SEPSIS)
1000.0000 mL | Freq: Once | INTRAVENOUS | Status: AC
  Administered 2023-04-03: 1000 mL via INTRAVENOUS

## 2023-04-03 NOTE — ED Notes (Signed)
 RN reviewed discharge instructions with pt. Pt verbalized understanding and had no further questions. VSS upon discharge.  

## 2023-04-03 NOTE — ED Triage Notes (Signed)
   EMS VS BP 128/88 HR 100 NSR  SpO2 99% RA  CBG 24

## 2023-04-03 NOTE — ED Triage Notes (Signed)
Pt via ems from work with near syncope today. Denies cp, sob; states she felt URI symptoms yesterday and has not felt well today. Endorses nausea, denies emesis. Pt alert & oriented, pale, still feels like she is going to pass out.

## 2023-04-03 NOTE — ED Provider Notes (Signed)
[place] EMERGENCY DEPARTMENT AT [facility] Provider Note   CSN: [id number] Arrival date & time: 04/03/23  1526     History  Chief Complaint  Patient presents with   Near Syncope    Karen Ellison is a 27 y.o. female without significant past medical history presents to the ED via EMS from work with a near syncopal episode.  She states she began to feel sore throat yesterday and has not felt well today.  Endorses nausea with and episode of vomiting while in the ED waiting area.  She also had cold sweats when she experienced her near syncopal episode.  Her symptoms improved when she laid flat on the floor, but returned when she stood back up.  Denies chest pain, shortness of breath, chest tightness, abdominal pain, congestion, rhinorrhea, trouble swallowing, fever.  Patient and spouse report their child is in daycare.         Home Medications Prior to Admission medications   Medication Sig Start Date End Date Taking? Authorizing Provider  ibuprofen (ADVIL) 600 MG tablet Take 1 tablet (600 mg total) by mouth every 6 (six) hours. 06/30/21   Marcelle Overlie, MD  oxyCODONE-acetaminophen (PERCOCET/ROXICET) 5-325 MG tablet Take 1 tablet by mouth every 4 (four) hours as needed (pain scale 4-7). 06/30/21   Marcelle Overlie, MD  Prenatal Vit-Fe Fumarate-FA (PRENATAL MULTIVITAMIN) TABS tablet Take 1 tablet by mouth daily at 12 noon.    [provider]  sertraline (ZOLOFT) 25 MG tablet Take 25 mg by mouth daily.    [provider]  tirzepatide (ZEPBOUND) 10 MG/0.5ML Pen Inject 10 mg into the skin once a week. 12/25/22     tirzepatide (ZEPBOUND) 12.5 MG/0.5ML Pen Inject 12.5 mg into the skin once a week. 03/01/23     tirzepatide (ZEPBOUND) 5 MG/0.5ML Pen Inject 5 mg into the skin weekly for 4 weeks. 10/22/22     tirzepatide (ZEPBOUND) 7.5 MG/0.5ML Pen Inject 7.5 mg into the skin once a week. 11/21/22     tirzepatide (ZEPBOUND) 7.5 MG/0.5ML Pen Inject 7.5 mg into the skin  once a week. 12/20/22         Allergies    Metrogel [metronidazole]    Review of Systems   Review of Systems  Constitutional:  Positive for diaphoresis. Negative for fever.  HENT:  Positive for sore throat. Negative for congestion, rhinorrhea and trouble swallowing.   Respiratory:  Negative for chest tightness and shortness of breath.   Cardiovascular:  Negative for chest pain.  Gastrointestinal:  Positive for nausea and vomiting. Negative for abdominal pain.  Neurological:  Positive for light-headedness. Negative for syncope.    Physical Exam Updated Vital Signs BP 130/85   Pulse 85   Temp 98 F (36.7 C) (Oral)   Resp 17   Ht 5\' 4"  (1.626 m)   Wt 103.3 kg   LMP 03/28/2023 (Approximate)   SpO2 100%   BMI 39.09 kg/m  Physical Exam Vitals and nursing note reviewed.  Constitutional:      General: She is not in acute distress.    Appearance: She is not ill-appearing.  HENT:     Mouth/Throat:     Lips: Pink.     Mouth: Mucous membranes are moist.     Pharynx: Oropharynx is clear. Uvula midline. Posterior oropharyngeal erythema present. No pharyngeal swelling, oropharyngeal exudate or postnasal drip.     Tonsils: No tonsillar exudate.  Cardiovascular:     Rate and Rhythm: Normal rate and regular rhythm.  Heart sounds: Normal heart sounds.  Pulmonary:     Effort: Pulmonary effort is normal. No tachypnea or respiratory distress.     Breath sounds: Normal breath sounds and air entry.  Abdominal:     General: Abdomen is flat. Bowel sounds are normal. There is no distension.     Palpations: Abdomen is soft.     Tenderness: There is no abdominal tenderness.  Skin:    General: Skin is warm and dry.     Capillary Refill: Capillary refill takes less than 2 seconds.     Coloration: Skin is pale.  Neurological:     Mental Status: She is alert. Mental status is at baseline.  Psychiatric:        Mood and Affect: Mood normal.        Behavior: Behavior normal.     ED Results  / Procedures / Treatments   Labs (all labs ordered are listed, but only abnormal results are displayed) Labs Reviewed  CBC WITH DIFFERENTIAL/PLATELET - Abnormal; Notable for the following components:      Result Value   WBC 18.1 (*)    Neutro Abs 14.7 (*)    Abs Immature Granulocytes 0.09 (*)    All other components within normal limits  COMPREHENSIVE METABOLIC PANEL - Abnormal; Notable for the following components:   Potassium 3.2 (*)    Glucose, Bld 113 (*)    AST 13 (*)    All other components within normal limits  URINALYSIS, ROUTINE W REFLEX MICROSCOPIC - Abnormal; Notable for the following components:   Ketones, ur 40 (*)    All other components within normal limits  RESP PANEL BY RT-PCR (RSV, FLU A&B, COVID)  RVPGX2  GROUP A STREP BY PCR  CULTURE, BLOOD (ROUTINE X 2)  CULTURE, BLOOD (ROUTINE X 2)  HCG, QUANTITATIVE, PREGNANCY  LACTIC ACID, PLASMA  PROTIME-INR  APTT    EKG EKG Interpretation Date/Time:  Wednesday April 03 2023 15:43:05 EST Ventricular Rate:  102 PR Interval:  144 QRS Duration:  74 QT Interval:  342 QTC Calculation: 445 R Axis:   77  Text Interpretation: Sinus tachycardia Cannot rule out Anterior infarct , age undetermined No previous ECGs available Confirmed by Gwyneth Sprout ([id number]) on 04/03/2023 4:08:53 PM  Radiology No results found.  Procedures Procedures    Medications Ordered in ED Medications  lactated ringers bolus 1,000 mL (0 mLs Intravenous Stopped 04/03/23 2015)  potassium chloride SA (KLOR-CON M) CR tablet 40 mEq (40 mEq Oral Given 04/03/23 2008)    ED Course/ Medical Decision Making/ A&P                                 Medical Decision Making Amount and/or Complexity of Data Reviewed Labs: ordered.   This patient presents to the ED with chief complaint(s) of sore throat, near syncope with non-contributory past medical history.  The complaint involves an extensive differential diagnosis and also carries with it a  high risk of complications and morbidity.    The differential diagnosis includes orthostatic hypotension, hypoglycemia, vasovagal syncope, metabolic derangement, cardiac dysrhythmia   The initial plan is to obtain ECG, labs  Initial Assessment:   Exam significant for well-appearing patient who is not in acute distress.  Skin is somewhat pale, but warm and dry.  Heart rate is normal in the 80s with regular rhythm.  Lungs clear to auscultation bilaterally.  Abdomen soft and non tender to palpation.  Mucus membranes are moist, lips appear dry.  Posterior oropharynx erythematous without exudate.  No obvious post nasal drip or congestion.   Independent ECG/labs interpretation:  The following labs were independently interpreted:  CBC with leukocytosis, WBC of 18.1.  Patient has had elevated WBCs in the past.  No anemia.  Metabolic panel with mild hypokalemia.  No major electrolyte disturbance.  Normal renal and hepatic function.  UA without infection, small amount of ketones. Negative for Covid, RSV, influenza, and strep pharyngitis.  Patient did meet SIRS criteria, therefore, additional labs were obtained: Lactic acid, APTT, PT, INR all within normal range.  Blood cultures were obtained as part of SIRS workup and are pending.  Treatment and Reassessment: Patient was given 1L IV fluids with improvement in symptoms.  She reports feeling much better.  Patient ate some honey chicken with rice and drank fluids without difficulty.  Orthostatic vital signs are reassuring.    Disposition:   No emergent findings to explain patient's near syncopal episode.  Patient may have element of dehydration and/or low blood sugar as a result of not eating or drinking today.  Workup is overall reassuring.  If blood cultures return positive, patient will be notified.  Discussed with patient eating small meals/snacks throughout the day and staying well hydrated.  Suspect pharyngitis is viral in nature.   The patient has  been appropriately medically screened and/or stabilized in the ED. I have low suspicion for any other emergent medical condition which would require further screening, evaluation or treatment in the ED or require inpatient management. At time of discharge the patient is hemodynamically stable and in no acute distress. I have discussed work-up results and diagnosis with patient and answered all questions. Patient is agreeable with discharge plan. We discussed strict return precautions for returning to the emergency department and they verbalized understanding.           Final Clinical Impression(s) / ED Diagnoses Final diagnoses:  Near syncope  Pharyngitis, unspecified etiology  Hypokalemia    Rx / DC Orders ED Discharge Orders     None         Lenard Simmer, PA-C 04/03/23 2149    Gwyneth Sprout, MD 04/08/23 1204

## 2023-04-03 NOTE — ED Provider Triage Note (Signed)
Emergency Medicine Provider Triage Evaluation Note  Oma Kornbluth , a 27 y.o. female  was evaluated in triage.  Pt complains of near syncope episode today. Notes that she started developing a URI yesterday then today was standing and felt like she was going to pass out. Endorses nausea and 'just feeling bad'. Denies chest pain, SOB  Review of Systems  Positive: Nausea Negative: Chest pain, diarrhea, vomiting  Physical Exam  BP (!) 137/90 (BP Location: Right Arm)   Pulse (!) 123   Temp (!) 97.3 F (36.3 C) (Oral)   Resp 19   SpO2 100%  Gen:   Awake, no distress   Resp:  Normal effort  MSK:   Moves extremities without difficulty   Medical Decision Making  Medically screening exam initiated at 3:41 PM.  Appropriate orders placed.  Swannie Rase was informed that the remainder of the evaluation will be completed by another provider, this initial triage assessment does not replace that evaluation, and the importance of remaining in the ED until their evaluation is complete.    Pete Pelt, [place] 04/03/23 [phone]

## 2023-04-03 NOTE — Discharge Instructions (Addendum)
Thank you for allowing [place] to be a part of your care today.  Your workup is overall reassuring.  You tested negative for strep, Covid, influenza, and RSV.  Your potassium was mildly low.  I have attached a list of foods you may incorporate into your diet to help replenish this.   Be sure to eat small meals or snacks throughout the day and stay well hydrated.  Medications like Zepbound can lower your blood sugar and lead to hypoglycemia (symptoms include dizziness, lightheadedness, sweating, headache, blurred vision, shakiness, anxiety, palpitations, irritability).   I recommend having your blood work rechecked in 1 week at your primary care office.   Return to the ED if you develop sudden worsening of your symptoms, have more episodes of fainting or nearly fainting, or if you have new concerns.

## 2023-04-08 LAB — CULTURE, BLOOD (ROUTINE X 2)
Culture: NO GROWTH
Special Requests: ADEQUATE

## 2023-04-09 ENCOUNTER — Other Ambulatory Visit (HOSPITAL_COMMUNITY): Payer: Self-pay

## 2023-04-09 ENCOUNTER — Encounter (HOSPITAL_COMMUNITY): Payer: Self-pay

## 2023-04-12 ENCOUNTER — Other Ambulatory Visit (HOSPITAL_COMMUNITY): Payer: Self-pay

## 2023-04-13 ENCOUNTER — Other Ambulatory Visit (HOSPITAL_COMMUNITY): Payer: Self-pay

## 2023-04-15 ENCOUNTER — Other Ambulatory Visit (HOSPITAL_COMMUNITY): Payer: Self-pay

## 2023-04-16 ENCOUNTER — Other Ambulatory Visit (HOSPITAL_COMMUNITY): Payer: Self-pay

## 2023-04-16 MED ORDER — ZEPBOUND 15 MG/0.5ML ~~LOC~~ SOAJ
15.0000 mg | SUBCUTANEOUS | 0 refills | Status: AC
  Filled 2023-04-16: qty 2, 28d supply, fill #0
  Filled 2023-05-06 – 2023-06-06 (×3): qty 2, 28d supply, fill #1

## 2023-05-06 ENCOUNTER — Other Ambulatory Visit: Payer: Self-pay

## 2023-05-09 ENCOUNTER — Other Ambulatory Visit (HOSPITAL_COMMUNITY): Payer: Self-pay

## 2023-05-09 MED ORDER — ZEPBOUND 15 MG/0.5ML ~~LOC~~ SOAJ
15.0000 mg | SUBCUTANEOUS | 2 refills | Status: AC
  Filled 2023-05-09: qty 2, 28d supply, fill #0
  Filled 2023-06-04: qty 2, 28d supply, fill #1

## 2023-05-14 ENCOUNTER — Other Ambulatory Visit (HOSPITAL_COMMUNITY): Payer: Self-pay

## 2023-05-31 ENCOUNTER — Other Ambulatory Visit (HOSPITAL_COMMUNITY): Payer: Self-pay

## 2023-05-31 MED ORDER — LEVONORGESTREL-ETHINYL ESTRAD 0.1-20 MG-MCG PO TABS
ORAL_TABLET | ORAL | 2 refills | Status: AC
  Filled 2023-05-31: qty 28, 28d supply, fill #0
  Filled 2023-06-29: qty 28, 28d supply, fill #1
  Filled 2023-07-30 – 2023-08-23 (×3): qty 28, 28d supply, fill #2

## 2023-06-07 ENCOUNTER — Other Ambulatory Visit (HOSPITAL_BASED_OUTPATIENT_CLINIC_OR_DEPARTMENT_OTHER): Payer: Self-pay

## 2023-06-07 ENCOUNTER — Other Ambulatory Visit: Payer: Self-pay

## 2023-06-19 ENCOUNTER — Other Ambulatory Visit (HOSPITAL_COMMUNITY): Payer: Self-pay

## 2023-06-19 MED ORDER — OSELTAMIVIR PHOSPHATE 75 MG PO CAPS
75.0000 mg | ORAL_CAPSULE | Freq: Two times a day (BID) | ORAL | 0 refills | Status: AC
  Filled 2023-06-19: qty 9, 4d supply, fill #0
  Filled 2023-06-19: qty 1, 1d supply, fill #0

## 2023-06-19 MED ORDER — PROMETHAZINE-DM 6.25-15 MG/5ML PO SYRP
ORAL_SOLUTION | ORAL | 0 refills | Status: AC
  Filled 2023-06-19: qty 240, 8d supply, fill #0

## 2023-07-02 ENCOUNTER — Other Ambulatory Visit (HOSPITAL_COMMUNITY): Payer: Self-pay

## 2023-07-03 ENCOUNTER — Other Ambulatory Visit: Payer: Self-pay

## 2023-07-12 ENCOUNTER — Other Ambulatory Visit (HOSPITAL_COMMUNITY): Payer: Self-pay

## 2023-07-12 MED ORDER — ZEPBOUND 7.5 MG/0.5ML ~~LOC~~ SOAJ
SUBCUTANEOUS | 0 refills | Status: AC
  Filled 2023-07-12 – 2023-08-20 (×3): qty 2, 28d supply, fill #0

## 2023-07-15 ENCOUNTER — Encounter (HOSPITAL_COMMUNITY): Payer: Self-pay

## 2023-07-15 ENCOUNTER — Other Ambulatory Visit (HOSPITAL_COMMUNITY): Payer: Self-pay

## 2023-07-18 ENCOUNTER — Other Ambulatory Visit: Payer: Self-pay

## 2023-07-30 ENCOUNTER — Other Ambulatory Visit: Payer: Self-pay

## 2023-07-30 ENCOUNTER — Other Ambulatory Visit (HOSPITAL_COMMUNITY): Payer: Self-pay

## 2023-07-31 ENCOUNTER — Other Ambulatory Visit (HOSPITAL_COMMUNITY): Payer: Self-pay

## 2023-07-31 ENCOUNTER — Other Ambulatory Visit: Payer: Self-pay

## 2023-08-09 ENCOUNTER — Other Ambulatory Visit (HOSPITAL_COMMUNITY): Payer: Self-pay

## 2023-08-09 MED ORDER — ZEPBOUND 10 MG/0.5ML ~~LOC~~ SOLN: 10.0000 mg | SUBCUTANEOUS | 1 refills | Status: AC

## 2023-08-14 ENCOUNTER — Other Ambulatory Visit (HOSPITAL_COMMUNITY): Payer: Self-pay

## 2023-08-15 ENCOUNTER — Other Ambulatory Visit (HOSPITAL_COMMUNITY): Payer: Self-pay

## 2023-08-20 ENCOUNTER — Other Ambulatory Visit (HOSPITAL_COMMUNITY): Payer: Self-pay

## 2023-08-23 ENCOUNTER — Other Ambulatory Visit (HOSPITAL_COMMUNITY): Payer: Self-pay

## 2023-08-24 ENCOUNTER — Other Ambulatory Visit (HOSPITAL_COMMUNITY): Payer: Self-pay

## 2023-08-27 ENCOUNTER — Other Ambulatory Visit (HOSPITAL_COMMUNITY): Payer: Self-pay

## 2023-09-13 ENCOUNTER — Other Ambulatory Visit (HOSPITAL_COMMUNITY): Payer: Self-pay

## 2023-11-28 ENCOUNTER — Other Ambulatory Visit: Payer: Self-pay
